# Patient Record
Sex: Female | Born: 1975 | Race: White | Hispanic: No | Smoking: Current every day smoker
Health system: Southern US, Community
[De-identification: ages and names within clinical notes are randomized; demographics above are authoritative.]

## PROBLEM LIST (undated history)

## (undated) DIAGNOSIS — M4120 Other idiopathic scoliosis, site unspecified: Secondary | ICD-10-CM

## (undated) DIAGNOSIS — N289 Disorder of kidney and ureter, unspecified: Secondary | ICD-10-CM

## (undated) DIAGNOSIS — M1990 Unspecified osteoarthritis, unspecified site: Secondary | ICD-10-CM

## (undated) HISTORY — PX: BREAST SURGERY: SHX581

## (undated) HISTORY — PX: BACK SURGERY: SHX140

---

## 2006-10-16 ENCOUNTER — Ambulatory Visit: Payer: Self-pay | Admitting: Physical Medicine & Rehabilitation

## 2006-10-16 ENCOUNTER — Encounter
Admission: RE | Admit: 2006-10-16 | Discharge: 2007-01-14 | Payer: Self-pay | Admitting: Physical Medicine & Rehabilitation

## 2006-12-01 ENCOUNTER — Ambulatory Visit: Payer: Self-pay | Admitting: Physical Medicine & Rehabilitation

## 2006-12-30 ENCOUNTER — Encounter
Admission: RE | Admit: 2006-12-30 | Discharge: 2007-03-30 | Payer: Self-pay | Admitting: Physical Medicine & Rehabilitation

## 2007-02-23 ENCOUNTER — Ambulatory Visit: Payer: Self-pay | Admitting: Physical Medicine & Rehabilitation

## 2007-04-21 ENCOUNTER — Encounter
Admission: RE | Admit: 2007-04-21 | Discharge: 2007-07-20 | Payer: Self-pay | Admitting: Physical Medicine & Rehabilitation

## 2007-04-21 ENCOUNTER — Ambulatory Visit: Payer: Self-pay | Admitting: Physical Medicine & Rehabilitation

## 2007-06-16 ENCOUNTER — Ambulatory Visit: Payer: Self-pay | Admitting: Physical Medicine & Rehabilitation

## 2007-07-22 ENCOUNTER — Emergency Department (HOSPITAL_COMMUNITY): Admission: EM | Admit: 2007-07-22 | Discharge: 2007-07-22 | Payer: Self-pay | Admitting: Emergency Medicine

## 2007-09-17 ENCOUNTER — Ambulatory Visit: Payer: Self-pay | Admitting: Physical Medicine & Rehabilitation

## 2007-09-17 ENCOUNTER — Encounter
Admission: RE | Admit: 2007-09-17 | Discharge: 2007-09-20 | Payer: Self-pay | Admitting: Physical Medicine & Rehabilitation

## 2007-09-23 ENCOUNTER — Ambulatory Visit (HOSPITAL_COMMUNITY): Admission: RE | Admit: 2007-09-23 | Discharge: 2007-09-23 | Payer: Self-pay | Admitting: Urology

## 2007-11-21 ENCOUNTER — Emergency Department (HOSPITAL_COMMUNITY): Admission: EM | Admit: 2007-11-21 | Discharge: 2007-11-21 | Payer: Self-pay | Admitting: Emergency Medicine

## 2007-12-09 ENCOUNTER — Ambulatory Visit: Payer: Self-pay | Admitting: Physical Medicine & Rehabilitation

## 2007-12-09 ENCOUNTER — Encounter
Admission: RE | Admit: 2007-12-09 | Discharge: 2007-12-13 | Payer: Self-pay | Admitting: Physical Medicine & Rehabilitation

## 2008-03-06 ENCOUNTER — Encounter
Admission: RE | Admit: 2008-03-06 | Discharge: 2008-03-08 | Payer: Self-pay | Admitting: Physical Medicine & Rehabilitation

## 2008-03-08 ENCOUNTER — Ambulatory Visit: Payer: Self-pay | Admitting: Physical Medicine & Rehabilitation

## 2008-06-06 ENCOUNTER — Encounter
Admission: RE | Admit: 2008-06-06 | Discharge: 2008-06-07 | Payer: Self-pay | Admitting: Physical Medicine & Rehabilitation

## 2008-06-07 ENCOUNTER — Ambulatory Visit: Payer: Self-pay | Admitting: Physical Medicine & Rehabilitation

## 2008-09-05 ENCOUNTER — Encounter
Admission: RE | Admit: 2008-09-05 | Discharge: 2008-12-04 | Payer: Self-pay | Admitting: Physical Medicine & Rehabilitation

## 2008-09-06 ENCOUNTER — Ambulatory Visit: Payer: Self-pay | Admitting: Physical Medicine & Rehabilitation

## 2008-10-24 ENCOUNTER — Ambulatory Visit (HOSPITAL_COMMUNITY): Admission: RE | Admit: 2008-10-24 | Discharge: 2008-10-24 | Payer: Self-pay | Admitting: Urology

## 2008-11-30 ENCOUNTER — Ambulatory Visit: Payer: Self-pay | Admitting: Physical Medicine & Rehabilitation

## 2008-12-28 ENCOUNTER — Encounter
Admission: RE | Admit: 2008-12-28 | Discharge: 2009-03-28 | Payer: Self-pay | Admitting: Physical Medicine & Rehabilitation

## 2008-12-29 ENCOUNTER — Ambulatory Visit: Payer: Self-pay | Admitting: Physical Medicine & Rehabilitation

## 2009-01-29 ENCOUNTER — Ambulatory Visit: Payer: Self-pay | Admitting: Physical Medicine & Rehabilitation

## 2009-03-06 ENCOUNTER — Ambulatory Visit: Payer: Self-pay | Admitting: Physical Medicine & Rehabilitation

## 2009-04-06 ENCOUNTER — Encounter
Admission: RE | Admit: 2009-04-06 | Discharge: 2009-07-02 | Payer: Self-pay | Admitting: Physical Medicine & Rehabilitation

## 2009-04-10 ENCOUNTER — Ambulatory Visit: Payer: Self-pay | Admitting: Physical Medicine & Rehabilitation

## 2009-05-09 ENCOUNTER — Ambulatory Visit: Payer: Self-pay | Admitting: Physical Medicine & Rehabilitation

## 2009-06-11 ENCOUNTER — Ambulatory Visit: Payer: Self-pay | Admitting: Physical Medicine & Rehabilitation

## 2009-07-31 ENCOUNTER — Encounter
Admission: RE | Admit: 2009-07-31 | Discharge: 2009-10-29 | Payer: Self-pay | Admitting: Physical Medicine & Rehabilitation

## 2009-07-31 ENCOUNTER — Ambulatory Visit: Payer: Self-pay | Admitting: Physical Medicine & Rehabilitation

## 2009-08-29 ENCOUNTER — Ambulatory Visit: Payer: Self-pay | Admitting: Physical Medicine & Rehabilitation

## 2009-09-26 ENCOUNTER — Ambulatory Visit: Payer: Self-pay | Admitting: Physical Medicine & Rehabilitation

## 2009-10-26 ENCOUNTER — Encounter
Admission: RE | Admit: 2009-10-26 | Discharge: 2009-11-08 | Payer: Self-pay | Admitting: Physical Medicine & Rehabilitation

## 2009-10-26 ENCOUNTER — Ambulatory Visit: Payer: Self-pay | Admitting: Physical Medicine & Rehabilitation

## 2009-12-26 ENCOUNTER — Encounter
Admission: RE | Admit: 2009-12-26 | Discharge: 2010-03-26 | Payer: Self-pay | Admitting: Physical Medicine & Rehabilitation

## 2009-12-28 ENCOUNTER — Ambulatory Visit: Payer: Self-pay | Admitting: Physical Medicine & Rehabilitation

## 2010-01-28 ENCOUNTER — Ambulatory Visit: Payer: Self-pay | Admitting: Physical Medicine & Rehabilitation

## 2010-02-28 ENCOUNTER — Ambulatory Visit: Payer: Self-pay | Admitting: Physical Medicine & Rehabilitation

## 2010-03-26 ENCOUNTER — Encounter
Admission: RE | Admit: 2010-03-26 | Discharge: 2010-06-24 | Payer: Self-pay | Admitting: Physical Medicine & Rehabilitation

## 2010-03-28 ENCOUNTER — Ambulatory Visit: Payer: Self-pay | Admitting: Physical Medicine & Rehabilitation

## 2010-05-15 ENCOUNTER — Ambulatory Visit: Payer: Self-pay | Admitting: Physical Medicine & Rehabilitation

## 2010-06-05 ENCOUNTER — Emergency Department (HOSPITAL_COMMUNITY): Admission: EM | Admit: 2010-06-05 | Discharge: 2010-06-05 | Payer: Self-pay | Admitting: Emergency Medicine

## 2010-06-06 ENCOUNTER — Ambulatory Visit (HOSPITAL_COMMUNITY): Admission: RE | Admit: 2010-06-06 | Discharge: 2010-06-06 | Payer: Self-pay | Admitting: Urology

## 2010-06-13 ENCOUNTER — Ambulatory Visit: Payer: Self-pay | Admitting: Physical Medicine & Rehabilitation

## 2010-07-04 ENCOUNTER — Encounter
Admission: RE | Admit: 2010-07-04 | Discharge: 2010-10-02 | Payer: Self-pay | Admitting: Physical Medicine & Rehabilitation

## 2010-07-11 ENCOUNTER — Ambulatory Visit: Payer: Self-pay | Admitting: Physical Medicine & Rehabilitation

## 2010-08-14 ENCOUNTER — Ambulatory Visit: Payer: Self-pay | Admitting: Physical Medicine & Rehabilitation

## 2010-08-24 ENCOUNTER — Emergency Department (HOSPITAL_COMMUNITY): Admission: EM | Admit: 2010-08-24 | Discharge: 2010-08-24 | Payer: Self-pay | Admitting: Emergency Medicine

## 2010-09-11 ENCOUNTER — Ambulatory Visit: Payer: Self-pay | Admitting: Physical Medicine & Rehabilitation

## 2010-10-02 ENCOUNTER — Encounter
Admission: RE | Admit: 2010-10-02 | Discharge: 2010-11-07 | Payer: Self-pay | Source: Home / Self Care | Attending: Physical Medicine & Rehabilitation | Admitting: Physical Medicine & Rehabilitation

## 2010-10-09 ENCOUNTER — Ambulatory Visit: Payer: Self-pay | Admitting: Physical Medicine & Rehabilitation

## 2010-11-07 ENCOUNTER — Ambulatory Visit: Payer: Self-pay | Admitting: Physical Medicine & Rehabilitation

## 2010-12-05 ENCOUNTER — Encounter
Admission: RE | Admit: 2010-12-05 | Discharge: 2010-12-10 | Payer: Self-pay | Source: Home / Self Care | Attending: Physical Medicine & Rehabilitation | Admitting: Physical Medicine & Rehabilitation

## 2010-12-08 ENCOUNTER — Encounter: Payer: Self-pay | Admitting: Orthopedic Surgery

## 2010-12-10 ENCOUNTER — Ambulatory Visit
Admission: RE | Admit: 2010-12-10 | Discharge: 2010-12-10 | Payer: Self-pay | Source: Home / Self Care | Attending: Physical Medicine & Rehabilitation | Admitting: Physical Medicine & Rehabilitation

## 2011-01-09 ENCOUNTER — Encounter: Payer: Medicare Other | Attending: Physical Medicine & Rehabilitation

## 2011-01-09 ENCOUNTER — Ambulatory Visit: Payer: Medicare Other

## 2011-01-09 DIAGNOSIS — M47812 Spondylosis without myelopathy or radiculopathy, cervical region: Secondary | ICD-10-CM | POA: Insufficient documentation

## 2011-01-09 DIAGNOSIS — M4120 Other idiopathic scoliosis, site unspecified: Secondary | ICD-10-CM

## 2011-01-09 DIAGNOSIS — F17200 Nicotine dependence, unspecified, uncomplicated: Secondary | ICD-10-CM | POA: Insufficient documentation

## 2011-01-09 DIAGNOSIS — F341 Dysthymic disorder: Secondary | ICD-10-CM | POA: Insufficient documentation

## 2011-01-09 DIAGNOSIS — M47814 Spondylosis without myelopathy or radiculopathy, thoracic region: Secondary | ICD-10-CM | POA: Insufficient documentation

## 2011-02-01 LAB — URINALYSIS, ROUTINE W REFLEX MICROSCOPIC
Bilirubin Urine: NEGATIVE
Ketones, ur: NEGATIVE mg/dL
Nitrite: NEGATIVE
Urobilinogen, UA: 0.2 mg/dL (ref 0.0–1.0)

## 2011-02-01 LAB — DIFFERENTIAL
Eosinophils Absolute: 0.3 10*3/uL (ref 0.0–0.7)
Lymphs Abs: 2.3 10*3/uL (ref 0.7–4.0)
Monocytes Absolute: 0.6 10*3/uL (ref 0.1–1.0)
Monocytes Relative: 8 % (ref 3–12)
Neutrophils Relative %: 61 % (ref 43–77)

## 2011-02-01 LAB — BASIC METABOLIC PANEL
CO2: 23 mEq/L (ref 19–32)
Calcium: 8.8 mg/dL (ref 8.4–10.5)
Chloride: 108 mEq/L (ref 96–112)
GFR calc Af Amer: >60 mL/min (ref >60)
Glucose, Bld: 87 mg/dL (ref 70–99)
Potassium: 4 mEq/L (ref 3.5–5.1)
Sodium: 138 mEq/L (ref 135–145)

## 2011-02-01 LAB — CBC
HCT: 36.8 % (ref 36.0–46.0)
Hemoglobin: 12.2 g/dL (ref 12.0–15.0)
MCH: 31.5 pg (ref 26.0–34.0)
MCV: 95.2 fL (ref 78.0–100.0)
RBC: 3.86 MIL/uL — ABNORMAL LOW (ref 3.87–5.11)

## 2011-02-06 ENCOUNTER — Encounter: Payer: Medicare Other | Attending: Physical Medicine & Rehabilitation

## 2011-02-06 ENCOUNTER — Ambulatory Visit: Payer: Medicare Other

## 2011-02-06 DIAGNOSIS — M545 Low back pain, unspecified: Secondary | ICD-10-CM | POA: Insufficient documentation

## 2011-02-06 DIAGNOSIS — F17200 Nicotine dependence, unspecified, uncomplicated: Secondary | ICD-10-CM | POA: Insufficient documentation

## 2011-02-06 DIAGNOSIS — M47812 Spondylosis without myelopathy or radiculopathy, cervical region: Secondary | ICD-10-CM | POA: Insufficient documentation

## 2011-02-06 DIAGNOSIS — Z981 Arthrodesis status: Secondary | ICD-10-CM | POA: Insufficient documentation

## 2011-02-06 DIAGNOSIS — M4120 Other idiopathic scoliosis, site unspecified: Secondary | ICD-10-CM

## 2011-02-06 DIAGNOSIS — F341 Dysthymic disorder: Secondary | ICD-10-CM | POA: Insufficient documentation

## 2011-02-06 DIAGNOSIS — M79609 Pain in unspecified limb: Secondary | ICD-10-CM | POA: Insufficient documentation

## 2011-02-06 DIAGNOSIS — M47814 Spondylosis without myelopathy or radiculopathy, thoracic region: Secondary | ICD-10-CM | POA: Insufficient documentation

## 2011-03-07 ENCOUNTER — Encounter: Payer: Medicare Other | Attending: Physical Medicine & Rehabilitation | Admitting: Physical Medicine & Rehabilitation

## 2011-03-07 DIAGNOSIS — M47817 Spondylosis without myelopathy or radiculopathy, lumbosacral region: Secondary | ICD-10-CM

## 2011-03-07 DIAGNOSIS — M47814 Spondylosis without myelopathy or radiculopathy, thoracic region: Secondary | ICD-10-CM | POA: Insufficient documentation

## 2011-03-07 DIAGNOSIS — M549 Dorsalgia, unspecified: Secondary | ICD-10-CM | POA: Insufficient documentation

## 2011-03-07 DIAGNOSIS — M79609 Pain in unspecified limb: Secondary | ICD-10-CM | POA: Insufficient documentation

## 2011-03-07 DIAGNOSIS — M4120 Other idiopathic scoliosis, site unspecified: Secondary | ICD-10-CM

## 2011-03-07 DIAGNOSIS — Y831 Surgical operation with implant of artificial internal device as the cause of abnormal reaction of the patient, or of later complication, without mention of misadventure at the time of the procedure: Secondary | ICD-10-CM | POA: Insufficient documentation

## 2011-03-07 DIAGNOSIS — M47812 Spondylosis without myelopathy or radiculopathy, cervical region: Secondary | ICD-10-CM | POA: Insufficient documentation

## 2011-03-07 DIAGNOSIS — M961 Postlaminectomy syndrome, not elsewhere classified: Secondary | ICD-10-CM

## 2011-03-07 DIAGNOSIS — T84498A Other mechanical complication of other internal orthopedic devices, implants and grafts, initial encounter: Secondary | ICD-10-CM | POA: Insufficient documentation

## 2011-03-07 DIAGNOSIS — F341 Dysthymic disorder: Secondary | ICD-10-CM | POA: Insufficient documentation

## 2011-03-07 DIAGNOSIS — IMO0001 Reserved for inherently not codable concepts without codable children: Secondary | ICD-10-CM | POA: Insufficient documentation

## 2011-03-08 NOTE — Assessment & Plan Note (Signed)
[name] is back regarding her back pain.  No decision has been made regarding surgery on her back yet as she is still a bit hesitant and wants to wait until her children are out of school.  Opana was increased in January, seems to have helped her.  Pain is about 5-6/10.  She has some sensations of leg weakness and numbness at times on the left.  She also feels her hardware move in the low back and frankly the hardware is broken by report.  Her sleep is fair.  She has occasional muscle spasms and trigger points in the shoulders and shoulder girdle muscles and trigger points helped her back in January.  She still gets these and does some massage to work them out when they do happen.  REVIEW OF SYSTEMS:  Notable for the above.  She does report depression, anxiety, occasional constipation, and night sweats.  Full 12-point review in the written health and history section of the chart.  SOCIAL HISTORY:  Unchanged.  She is divorced, living with her children. She is still smoking.  PHYSICAL EXAMINATION:  VITAL SIGNS:  Blood pressure is 106/76, pulse is 120, respiratory rate 18, and she is saturating 100% on room air. GENERAL:  The patient is pleasant, alert, and oriented x3.  She seems to have lost some weight.  She is very tan.  Mood is pleasant. MUSCULOSKELETAL:  Posture is still very poor as she has significant elevation of the right hemipelvis and levoscoliosis of the thoracolumbar spine.  She is able to bend to about 60-70 degrees with discomfort.  She is limited with rotation to the left and bending to the left today. Extension was achieved to about 15 degrees.  All these movements caused some pain.  ASSESSMENT: 1. 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  The patient has fractured hardware     by report. 2. Cervical spondylosis. 3. Reactive depression. 4. Myofascial pain, partially related to her posture and  overuse.  PLAN: 1. Encouraged overall stretching and modalities for her myofascial     pain.  She needs to be sensible about her activities. 2. We will stay with the Opana ER 30 q.12 h as this seems to have     improved her pain control.  She will use oxycodone 7.5 for     breakthrough pain 1 q.6 h p.r.n., #120. 3. Discussed smoking cessation. 4. Continue other medications as written. 5. Nurse practitioner will see her back in a month.     Ranelle Oyster, M.D. Electronically Signed    ZTS/MedQ D:  03/07/2011 13:38:20  T:  03/08/2011 02:29:01  Job #:  [id number]

## 2011-04-01 NOTE — Assessment & Plan Note (Signed)
The patient returns to the clinic today accompanied by her husband.  The  patient reports that she still has some problems with her kidneys and  continues to see her kidney specialist, Dennie Maizes, M.D.  Follow-up  is scheduled for September 24, 2007.  She has had several kidney stones  and reportedly has inflammation of her ureters.   Concerning her back, she has had increased back pain since her surgery  with Dr. Cherene Altes in January of 2008.  Dr. Cherene Altes apparently wishes to do a  second surgery at the L5-S1 disk level.  The patient reports that  reportedly bolts are backing out.  She also plans to get a second  opinion with an orthopedist, Marlowe Kays, M.D. in [place].  She  does not have a specific date for that appointment.   The patient reports that she is unable to tolerate the Robaxin or  Trazodone as they gave her a hangover.  She is caring for 4-5 children  at the present time with her husband who is the only bread winner.  The  patient has applied for disability, but has been denied at this point.  She does need a refill on her Oxycodone in the office today.   MEDICATIONS:  1. Oxycodone 7.5/325 one to two tablets p.o. q.i.d. p.r.n.  2. Robaxin (unable to tolerate).  3. Trazodone (unable to tolerate).   REVIEW OF SYSTEMS:  Positive for constipation, urinary retention, high  blood sugar.   PHYSICAL EXAMINATION:  GENERAL:  Reasonably well appearing young adult  female in mild acute discomfort.  VITAL SIGNS:  Not obtained in the office today.  She has 5-/5 strength  throughout the bilateral upper extremities and 4/5 strength throughout  the bilateral lower extremities.   IMPRESSION:  1. Moderate to severe lower thoracic to lumbar scoliosis, status post      T1-L5 fusion for severe scoliosis.  2. Reported L5 tear per latest CAT scan.   In the office today, we did refill the patient's Oxycodone 7.5/325 one  to two tablets p.o. q.i.d.  She still has approximately six  remaining  from the prescription filled on August 19, 2007.  She certainly does not  overuse the medication and generally uses 7-8 per day with reasonable  relief.  I will fill the prescription for her in the office today.  We  will plan on seeing her in follow-up in approximately three months'  time.           ______________________________  Ellwood Dense, M.D.     DC/MedQ  D:  09/20/2007 11:38:54  T:  09/20/2007 21:29:03  Job #:  [id number]

## 2011-04-01 NOTE — Assessment & Plan Note (Signed)
[name] is back regarding her chronic back pain.  She stopped taking the  Topamax after a month as she did not see any effects; however, she had  no problems.  She feels that her pain had gotten worse in the neck and  back as well as her legs.  Mood is also an ongoing problem as well.  Her  pain is 6-7/10 today, unchanged from last visit.  Pain is described as  sharp, burning, dull, stabbing, constant, tingling, and aching.  Pain  interferes with general activity, relations with others, enjoyment of  life on moderate-to-severe level.  Sleep is fair.   REVIEW OF SYSTEMS:  Notable for the above.  The patient does report some  weakness, trouble walking, spasms, depression, anxiety, low blood  sugars, some weight gain, night sweats, urine retention, limb swelling.  Full review is in the written health and history section.  Other  pertinent positives are above.   SOCIAL HISTORY:  The patient is divorced, living with her child.  She is  still smoking regularly.   PHYSICAL EXAMINATION:  Blood pressure is 138/72, pulse is 99,  respiratory rate 18.  She is sating 99% on room air.  The patient is  pleasant, alert and oriented x3.  She continues to have tenderness,  particularly in the low back at the operative site.  Strength in all 4  extremities, however, is 5/5 with 2 plus reflexes and normal sensory  examination.  She has pain with flexion of the low back and neck today  with equivocal extension and provocation in the back and neck.  Mood is  a bit anxious, although cognition is intact.  Cranial nerve exam is  normal.  Heart is regular.  Chest is clear.  Abdomen is soft, nontender.  The patient's gait essentially is normal.   ASSESSMENT:  1. History of thoracolumbar scoliosis status post fusion surgery x2.  2. Cervical spondylosis with myofascial pain.  3. 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  4. Reactive depression.  5. Central sensitization.   PLAN:  1. We  trialed Topamax starting at 50 mg at bedtime increasing to 100      mg to improve sleep and perhaps stabilize mood.  2. We will change Celexa over to Effexor, ultimately moving to 75 mg      daily dose for starters.  3. The patient may need follow up with Dr. Elvina Sidle regarding her back and      surgery, although I am not sure what else he would add at this      point.  The patient seems to feel that she might benefit by seeing      him.  I did not truly encourage her one over the other.  4. Encourage TENS and back exercises.  5. Percocet for breakthrough pain 7.5, #120, one q.6 h. p.r.n.  6. I will see her back in about 2 months' time with nursing followup      in 1 month.      Ranelle Oyster, M.D.  Electronically Signed     ZTS/MedQ  D:  06/11/2009 10:50:26  T:  06/12/2009 01:05:25  Job #:  [id number]   cc:   Sharon Seller A. Elvina Sidle, MD   Dierdre Highman. Loney Hering, MD

## 2011-04-01 NOTE — Assessment & Plan Note (Signed)
[name] returns to the clinic today for followup evaluation.  She  had her surgery with Dr. Elvina Sidle November 20, 2007.  She has had persistent  pain since that time, and Dr. Elvina Sidle reportedly has told her that she will  need another surgery in the near future.  She wanted a second opinion  and sought that with Dr. Wynetta Emery, a local neurosurgeon.  Dr. Wynetta Emery  apparently agreed with Dr. Elvina Sidle that a repeat back surgery was necessary.  She is still trying to decide when that would be done as she is trying  to care for her three small children.   The patient reports that she has also been hospitalized at [facility] November 21, 2007 for a chronic kidney disorder with kidney  stones.  She reports that she was placed on ciprofloxacin for 14 days,  and is still trying to get in to see her kidney specialist, Dr.  Rito Ehrlich.   The patient had undergone subsequent CAT scan of her lumbar spine  December 08, 2007.  This showed no fracture, subluxation, disk herniation  or spinal stenosis.  There were prior surgical changes noted.  There is  no subluxation or new fracture seen with minimal spondylosis being  identified.  There was a subcortical cyst formation involving the sacrum  on the left side at the SI joint level.  No erosivesacroiliitis was  seen.   Patient reports that she does get reasonable relief from the oxycodone  7.5/325 used approximately 6-8 tablets per day.  She does need a refill  over the next five days.   MEDICATIONS:  Oxycodone 7.5/325 one to two tablets p.o. q.i.d. p.r.n. (6-  8 per day).   REVIEW OF SYSTEMS:  Noncontributory.   PHYSICAL EXAMINATION:  Well-appearing young adult female in mild acute  discomfort.  Blood pressure:  117/61 with a pulse of  81.  Respiratory  rate:  18.  O2 saturation 96% on room air.  She has 5-/5 strength  throughout the bilateral upper extremities and 4/5 strength through the  bilateral lower extremities.   IMPRESSION:  1. Moderate to severe  lower thoracic to lumbar scoliosis, status post      T1-L5 fusion for severe scoliosis.  2. Reported L5 tear per CAT scan.  3. Minimal lumbar degenerative disk disease.  4. History of chronic kidney disorder with kidney stones.   In the office today we did refill the patient's oxycodone as of December 18, 2007.  At this point we will have to fill out repeat paperwork for  her as she does not feel that she could work even eight hours a day at a  sedentary position.  I unfortunately made out paperwork to a different  level when I thought that in early December 2008.  Once she sends [place]  repeat paperwork, we will fill it out as noted above.  We will plan on  seeing the patient in followup in approximately three months' time.  She  still plans to see Dr. Elvina Sidle tomorrow, and they are still planning surgery  either imminent over the next few weeks or possibly in June if at all  possible to accommodate her children's schedules.           ______________________________  Ellwood Dense, M.D.     DC/MedQ  D:  12/13/2007 11:25:36  T:  12/13/2007 14:25:51  Job #:  [id number]

## 2011-04-01 NOTE — Assessment & Plan Note (Signed)
[name] returns to the clinic today for follow-up evaluation.  She  reports that she did see Dr. Elvina Sidle in follow-up. She reports that she was  having severe pain and they did a CAT scan of her lumbar spine at Dr.  Layla Maw office.  She reports that Dr. Elvina Sidle subsequently called back saying  that she has a tear in the L5 disc along with arthritis.  They are  planning to see her in follow-up June 04, 2007 when they may be doing  injections or possibly recommending further surgery.   The patient reports that she is not getting as much relief from the  Flexeril that had been used for sleep and muscle relief. She would like  to try a different medication for that problem.  In terms of her pain  medicine she has been using the oxycodone 7.5/325 one tablet 4 times per  day.  She actually was prescribed 1 to 2 tablets p.o. q.i.d. but she  reports that she is just now out of a script given to her February 26, 2007  for 240 tablets.  Those 240 should have covered her only through May  11th but they have lasted her until early June.  She definitely has not  been using them at the allowable dosage.  She has been under using the  medications and I have explained that to her in the office today along  with her friend.   MEDICATIONS:  1. Oxycodone 7.5/325 one to two tablets p.o. q.i.d. p.r.n..  2. Flexeril 10 mg p.o. nightly p.r.n.   REVIEW OF SYSTEMS:  Noncontributory.   PHYSICAL EXAMINATION:  This is a reasonably well appearing, young adult  female in mild-to-moderate acute discomfort.  Blood pressure is 113/70,  with a pulse 94, O2 saturation 100% on room air.  She has 5-/5 strength  throughout the bilateral upper extremities and 4/5 strength throughout  the bilaterally lower extremities.   IMPRESSION:  1. Moderate-to-severe lower thoracic to lumbar scoliosis, status post      T11-L5 fusion for severe scoliosis.  2. Reported L5 tear per latest CAT scan.   In the office today we did refill the  patient's oxycodone 7.5/325 and  made sure that she was aware that she could use it 1 to 2 tablets p.o.  q.i.d. a maximum of 8 per day.  We also took her off the Flexeril and  instead are using Robaxin 500 p.o. nightly and trazodone 50 mg 1 to 2  tablets p.o. nightly p.r.n.  We will plan on seeing her in follow-up in  approximately 2 to 3 months' time with refills prior to that appointment  if necessary.           ______________________________  Ellwood Dense, M.D.     DC/MedQ  D:  04/22/2007 10:30:58  T:  04/22/2007 10:54:02  Job #:  [id number]

## 2011-04-01 NOTE — Assessment & Plan Note (Signed)
Ms. Leiva returns to clinic today for followup evaluation.  She  reports that she and Dr. Cherene Altes had subsequent discussions regarding her  back, and the problems that she is experiencing.  They are suggesting  that she have subsequent surgery to remove prior hardware and replace  hardware along with bigger screws.  They also are suggesting, according  to the patient, scraping off the bottom of the L5 disk.  She and her  surgeon are still trying to come up with a specific date for this  surgery.   In the meantime, the patient reports that she gets a fair amount of  relief from the oxycodone used up to 8 tablets per day.  She is getting  better relief with the Robaxin compared to the prior Flexeril.  She  reports that trazodone at 50 mg to 100 mg makes her too sleepy to attend  to the kids through the night.  She has not tried taking 1/2 of a 50-mg  tablet.   MEDICATIONS:  1. Oxycodone 7.5/325 one to two tablets p.o. q.i.d. p.r.n.  2. Robaxin 500 mg p.o. nightly p.r.n.  3. Trazodone 50 mg 1 to 2 tablets p.o. nightly p.r.n.   REVIEW OF SYSTEMS:  Noncontributory.   PHYSICAL EXAMINATION:  Well-appearing adult female in mild acute  discomfort.  Blood pressure 115/72 with a pulse of 97.  Respiratory rate 18.  Her O2  saturation 100% on room air.  She has 5-/5 strength throughout the bilateral upper extremities, and  4/5 strength throughout the bilateral lower extremities.   IMPRESSION:  1. Moderate to severe lower thoracic to lumbar scoliosis, status post      T11/L5 fusion for severe scoliosis.  2. Reported L5 tear per latest CAT scan.   In the office today, we did refill the patient's oxycodone as of June 19, 2007.  She will continue with the Robaxin.  I have asked her to try  the trazodone at 1/2 of a 50-mg strength to see if that gives her some  benefit for sleep, but also allows her to be alert to attend to the  children.  We will plan on seeing her in followup in approximately  3  months' time with refills prior to that appointment if necessary.           ______________________________  Ellwood Dense, M.D.     DC/MedQ  D:  06/17/2007 09:52:46  T:  06/17/2007 15:40:30  Job #:  [id number]

## 2011-04-01 NOTE — Assessment & Plan Note (Signed)
[name] returns to the clinic today for followup evaluation.  She  reports that her primary care physician has started her on Celexa 20 mg  daily for anxiety and depression.  She also reports that Dr. Elvina Sidle, her  surgeon, has ordered CAT scan of her lumbar spine with results pending.  She also was sent for physical therapy after she had some degenerative  changes found in her left shoulder.  She is using her Percocet  approximately 6-8 tablets per day, but is out of the Valium.  She does  complain of increased spasms off the Valium at present.   REVIEW OF SYSTEMS:  Positive for fever and chills, skin rash, night  sweats, constipation, and urinary retention.   MEDICATIONS:  1. Oxycodone 10/325 one to two tablets p.o. q.i.d. p.r.n. (6-8 per      day).  2. Valium 5 mg b.i.d. p.r.n. (out at present).   PHYSICAL EXAMINATION:  GENERAL:  Well-appearing, young adult female  seated in a regular chair.  VITAL SIGNS:  Blood pressure is 121/71 with pulse 88, respiratory rate  18, and O2 saturation 100% on room air.  She has 5-/5 strength  throughout the bilateral upper extremities and 4/5 strength throughout  the bilateral lower extremities.  She ambulates without any assistive  device.   IMPRESSION:  1. Moderate-to-severe lower thoracic-to-lumbar scoliosis, status post      T1-L5 fusion for severe scoliosis.  2. Reported L5 disk tear per CAT scan.  3. Minimal lumbar degenerative disk disease.  4. History of chronic renal disorder with kidney stones.   PLAN:  In the office today, we did refill the patient's Valium 5 mg  b.i.d. p.r.n.  We also refilled her Percocet as of September 16, 2008,  maximum of 8 per day.  We will plan on seeing the patient in followup in  approximately 3 months' time with refills prior to that appointment as  necessary.           ______________________________  Ellwood Dense, M.D.     DC/MedQ  D:  09/06/2008 11:13:33  T:  09/07/2008 00:12:56  Job #:   [id number]

## 2011-04-01 NOTE — Assessment & Plan Note (Signed)
HISTORY:  Ms. Grondahl returns to the clinic today for follow-up  evaluation.  She had her surgery done on February 02, 2008.  She was  prescribed Valium 5 mg twice daily by Dr. Dorita Fray, her neurosurgeon.  She is out of that medication and reports significant spasms of her left  lower back.  She would like to restart that medication.  She also  reports taking the oxycodone 7.5/325 mg approximately six to eight tab  daily and getting reasonable relief overall.  She and her attorney are  in the process for applying for disability, as best as I can tell.   MEDICATIONS:  1. Oxycodone 7.5/325 mg, one to two tab p.o. four times daily p.r.n.      (six to eight per day).  2. Valium 5 mg twice daily p.r.n.   REVIEW OF SYSTEMS:  Positive for night sweats, fever and chills,  constipation, poor appetite, abdominal pain, urinary retention, limb  swelling and shortness of breath.   PHYSICAL EXAMINATION:  GENERAL:  A well-appearing young adult female,  seated in a regular chair, in mild to no acute discomfort.  VITAL SIGNS:  Blood pressure 121/81, pulse 108, respirations 18, O2  saturation 100% on room air.  NEUROLOGIC:  She has 5-/5 strength  throughout the bilateral upper extremities and 4/5 strength throughout  the bilateral lower extremities.   IMPRESSION:  1. Moderate to severe lower thoracic to lumbar scoliosis, status post      T1-L5 fusion for severe scoliosis.  2. Reported L5 tear per CAT scan.  3. Minimal lumbar degenerative disk disease.  4. History of chronic kidney disorder with kidney stones.   In the office today we did give the patient a refill on her oxycodone.  We also refilled her Valium in a 5 mg twice daily strength and  frequency.   FOLLOWUP:  We will plan on seeing the patient in followup in this office  in approximately three months' time.           ______________________________  Ellwood Dense, M.D.     DC/MedQ  D:  03/08/2008 11:39:25  T:  03/08/2008 12:53:41   Job #:  [id number]

## 2011-04-01 NOTE — Assessment & Plan Note (Signed)
[name] is back regarding her chronic pain.  Her back knees give her  problem as well as both legs.  Her left shoulder particularly is hurting  her.  She has had over the last week or so flare-up of her right knee  pain.  Apparently, she has osteoarthritis.  She has never formally had a  workup apparently.  The often swells are related to the amount of  activity she undertakes.  Her pain is 6-7/10.  The pain is sharp,  burning, stabbing, aching, constant, intermittent at times.  Pain is  interfering with general activity, relations with others, enjoyment of  life on a moderate-to-severe level.  Sleep is poor still.   She tried the fentanyl patch.  She is still taking this, but having  nausea.  It does not feel that she has a significant benefit with it.  She stopped the Neurontin that she felt that was making her more  sensitive to pain.   REVIEW OF SYSTEMS:  Notable for multiple items as outlined in her health  and history section.  Other pertinent positives are above.   SOCIAL HISTORY:  The patient is divorced, living with a boyfriend, and  children.  Boyfriend is with her today.   PHYSICAL EXAMINATION:  VITAL SIGNS:  Blood pressure is 125/71, pulse is  104, respiratory rate 18.  She is sating 99% on room air.  GENERAL:  The patient is pleasant, alert, and oriented x3.  Affect is  generally bright and appropriate.  EXTREMITIES:  Gait is generally stable.  Reflexes are 2+ in both knees  at 3-4/5 strength in both legs with inhibition.  The right knee had some  mild crepitus in the infrapatellar region.  No obvious meniscal signs  were elicited today.  Back was generally tender to touch once again.  We  ranged the neck and she had pain in the left trapezius with all planes  of motion, particularly bending laterally to the left and right.  She  had palpable trigger point over the left trapezius today.  Spurling's  test was equivocal, but negative.  Strength is generally 5/5 in both  upper  extremities with normal range of motion in the arms.  Sensation  was normal.  HEART:  Slightly tachycardiac.  CHEST:  Clear.  ABDOMEN:  Soft, nontender.   ASSESSMENT:  1. History of thoracolumbar scoliosis, status post fusion surgery x2.      I reviewed her films today.  2. Cervical spondylosis and questionable radiculopathy.  3. Cervicothoracic myofascial pain.  4. Osteoarthritis of the right knee likely in the patellofemoral      compartment.  5. Reactive depression.  6. Central sensitization syndrome.   PLAN:  1. I like to try another anticonvulsant for her central pain aspects.      Topamax 25 mg nightly titrating up to 75 mg after 2 weeks' time.  2. We will replace her fentanyl patch with Opana ER 5 mg q.12 h.  3. Refilled oxycodone 7.5 one q.6 h. p.r.n. #120.  4. Maintain Mobic 15 mg daily.  5. After informed consent, we injected the right knee via lateral      approach using 40 mg of Kenalog and 3 mL of 1% lidocaine.  The      patient tolerated well.  The area was prepped with Betadine and      cleaned after injection.  6. Also, we injected the left trapezius trigger points x2 using 2 mL      of  1% lidocaine.  I elicited a nice twitch response on the second      injection.  The patient overall tolerated well.  7. Encouraged use of soft brace for her right knee for now.  I think      she should work on some quad and calf stretching exercise as well      as hamstring exercises.  8. Follow up with orthopedic surgery.  9. I will see her back in a month.       Ranelle Oyster, M.D.  Electronically Signed     ZTS/MedQ  D:  03/06/2009 10:18:18  T:  03/06/2009 23:52:47  Job #:  [id number]   cc:   Dorita Fray  Fax: [phone]

## 2011-04-01 NOTE — Assessment & Plan Note (Signed)
[name] returns to clinic today for followup evaluation.  She did  see Dr. Elvina Sidle or at least the PA with Dr. Elvina Sidle in May 2009.  She is due for  followup in June 16, 2008.  She still has pain across her chest and in  her axilla regions bilaterally especially in the left.  She feels that  somewhat related to rib pain from her prior scoliosis surgery.  She did  undergo T1-L5 fusion for scoliosis by Dr. Elvina Sidle several months ago.   The patient does use the Percocet approximately 6 to 8 tablets per day.  She does need a refill over the next several days.  She also request  that we refill the Valium at 5 mg twice a day as needed, as Dr. Elvina Sidle  apparently is not willing to do that for her.   She reports that her primary care physician is planning to start her on  the Celexa in the near future.  She feels that is for anxiety related to  her ongoing pain.   REVIEW OF SYSTEMS:  Positive for night sweats, low blood sugar,  constipation, poor appetite, and urinary retention.   MEDICATIONS:  1. Oxycodone 10/325 one to two tablets p.o. q.i.d. p.r.n. (6 to 8 per      day)  2. Valium 5 mg b.i.d. p.r.n.   PHYSICAL EXAMINATION:  GENERAL:  Well-appearing young adult female,  seated in a regular chair in mild-to-no acute discomfort.  VITAL SIGNS:  Blood pressure 124/67 with a pulse of 86, respiratory rate  20, and O2 saturation 100% on room air.  She has 5-/5 strength  throughout the bilateral upper extremities and 4/5 strength throughout  the bilateral lower extremities.  Bulk and tone were normal throughout,  and the patient ambulates without any assistive device.   IMPRESSION:  1. Moderate-to-severe lower thoracic to lumbar scoliosis, status post      T1-L5 fusion for severe scoliosis.  2. Reported L5 disk tear per CAT scan.  3. Minimal lumbar degenerative disk disease.  4. History of chronic kidney disorder with kidney stones.   In the office today, we did refill the patient's Valium as of today  June 07, 2008.  We referred her Percocet as of June 17, 2008, and she will  fill that over the next several days when her present prescription runs  out.  We will plan on seeing her in followup at approximately 3 month's  time with refills prior to that appointment if necessary.  She will be  discussing her ongoing pain with Dr. Elvina Sidle, but I suspect that there will  be no definite resolution with ongoing pain medicines required through  this office.           ______________________________  Ellwood Dense, M.D.     DC/MedQ  D:  06/07/2008 10:37:34  T:  06/08/2008 01:40:55  Job #:  [id number]

## 2011-04-01 NOTE — Assessment & Plan Note (Signed)
I am assuming care for this patient from Dr. Thomasena Edis who has followed  her chronically.  The patient is here for followup of her low back and  leg pain.  She has chronic thoracic and lumbar scoliosis status post 2  separate fusion surgeries, the last which was in March 2009.  She states  that with last surgery the pain has been persistent and really not  improved with pain prominent in low back and the left leg areas.  She  had a lot of paresthesias down the left leg with associated numbness and  weakness.  She also has cervical symptoms with radiation into the left  shoulder.  She sees Dr. Elvina Sidle in [place] for her surgical care.  He is  talking about doing a further cervical surgery.  He is recommending some  therapy first.  I discussed the patient's prior treatments before seeing  me today.  She has had therapy remotely for low back and nothing recent.  She has had injections in the past as well as the surgeries noted above.  She is really not on anything to treat her neurological pain symptoms  nor any anti-inflammatories.  She has been on a short-acting narcotics  per Dr. Thomasena Edis most recently with no trials of long-acting agent.  She  has been on Valium recently for spasms which she uses p.r.n..  Her  current oxycodone is written at 7.5/325 two q.i.d. essentially p.r.n.  She is using 8 a day for pain control.  She feels very limited with her  activities.  She has a hard time bending and doing things with her  children rather than taking care of herself.  She rates her pain at 5-  7/10 described as sharp, stabbing, constant tingling, and aching.  Pain  interferes with general activity, relations with others, and enjoyment  of life on a moderate-to-severe level.  Sleep is poor.  Leg numbness  happens with activity, even such things as crossing her legs.  She has  back pain and neck pain nearly all the time.  She states that she can  walk about 2 minutes without having to stop.   REVIEW  OF SYSTEMS:  Notable for bladder control problems, weakness,  numbness, tingling, trouble walking, spasms, depression, anxiety, weight  gain, fever, night sweats, low sugars, urinary retention, poor appetite,  limb swelling, and shortness of breath.  Full review is in the written  health and history section.  Other pertinent positives are above.   SOCIAL HISTORY:  The patient is divorced and living with her children.  She smokes 1-pack of cigarettes per day.   PHYSICAL EXAMINATION:  Blood pressure 120/72, pulse 93, respiratory rate  18.  She is sating 99% on room air.  The patient is pleasant, alert,  oriented x3.  Affect is bright and appropriate.  She is very limited  with all movements of low back.  She is able to stand and bend to  approximately 20 degrees before severe pain set in the low back and left  leg.  Straight leg raising was positive in left leg and slightly on the  right as well.  Extension was limited to 5 or 10 degrees with pain.  She  had tenderness with palpation over the lower lumbar paraspinals left  more than right, particularly from L3-S1, although there was pain to her  operative area more superiorly.  Pelvis was uneven with the right  elevated over the left by approximately 1 inch; however, there was a  leftward scoliosis apex in the lower thoracic spine.  Leg length  measurements did not reveal any discrepancies.  Strength is generally  4+/5 in the right.  She is 3+ to 4/5 in the left with pain inhibition.  Reflexes were 2+ at both knees, 2+ right ankle, 1+ left ankle.  Sensory  exam is grossly normal.  Upper extremity motor exam was generally 4 to  5/5 throughout with pain inhibition in both shoulders.  I did not  examine her neck and back today.   ASSESSMENT:  1. History of thoracolumbar scoliosis, status post 2 thoracolumbar      fusions last of which in March 2009.  The patient with ongoing      chronic low back pain and radiculopathy on the left.  She       apparently reported L5 disk injury per recent CAT scan which I do      not have available to me.  2. Reactive depression.  3. Cervical spondylosis and arthropathy as well.   PLAN:  1. We discussed about overall management of her pain condition.  I      would like to get her started on fentanyl patch 25 mcg q.72 h and      reduce oxycodone to 7.5 one q.6 h. p.r.n.  She was in agreement      with this.  Additionally, we will start her on anticonvulsive      Neurontin 300 mg nightly titrating up to t.i.d. over 10 days' time.  2. Begin nonsteroidal anti-inflammatory, Mobic 15 mg 1 p.o. q.a.m.  3. I would like her to bring in her most recent copy of her cervical      and lumbar films, so we can assess.  We can discuss further      interventional treatment.  I am in agreement with her cervical      physical therapy.  I would like to see if they can attempt TENS      with her as well.  Ultimately, I would like to get her in for low      back physical therapy to improve her range of motion, stamina,      posture, etc.  I think once her pain is decreased a bit, then we      can pursue this.  4. Use Valium only p.r.n.  5. I will see her back in approximately 1 month's time.      Ranelle Oyster, M.D.  Electronically Signed     ZTS/MedQ  D:  01/29/2009 12:49:47  T:  01/30/2009 03:14:41  Job #:  [id number]   cc:   Dorita Fray  Fax: [phone]   Ernestine Conrad, MD

## 2011-04-01 NOTE — Assessment & Plan Note (Signed)
[name] returns to clinic today for followup evaluation.  She  reports that overall she is getting good relief from her oxycodone and  Valium.  She does use approximately 6-8 per day of the oxycodone.  She  does use the Valium just as needed twice a day.  She does report that  she has increased pain in her left leg when she is sitting and crossing  her leg and also prolonged standing will increase her pain.   REVIEW OF SYSTEMS:  Positive for fever and chills, weight loss, night  sweats, poor appetite, limb swelling, shortness of breath, and urinary  retention.   MEDICATIONS:  1. Oxycodone 10/325 one to two tablets p.o. q.i.d. p.r.n. (6-8 per      day).  2. Valium 5 mg b.i.d. p.r.n. (1-2 per day).   The patient reports that her pain is interfering with her traveling,  social life, sleeping, standing, walking, sitting, lifting, and  household employment along with homemaking.   PHYSICAL EXAMINATION:  GENERAL:  A well-appearing young adult female  seated in a regular chair.  VITAL SIGNS:  Blood pressure was 129/69 with a pulse of 108, respiratory  rate 18, and O2 saturation 99% on room air.  MUSCULOSKELETAL:  She has 5-/5 strength throughout the bilateral upper  extremities and 4+/5 strength throughout the bilateral lower  extremities.  She ambulates without any assistive device.   IMPRESSION:  1. Moderate to severe lower thoracic to lumbar scoliosis, status post      T1-L5 fusion for severe scoliosis.  2. Reported L5 disk tear per CAT scan.  3. Minimal lumbar degenerative disk disease.  4. History of chronic renal disorder with kidney stones.   In the office today, we did refill the patient's oxycodone as of  December 20, 2008, and her Valium as of today.  We will plan on seeing  her in followup either with myself or with a nurse in 1 month's time.           ______________________________  Ellwood Dense, M.D.     DC/MedQ  D:  11/30/2008 10:51:05  T:  12/01/2008  01:11:31  Job #:  [id number]

## 2011-04-01 NOTE — Assessment & Plan Note (Signed)
[name] is back regarding her chronic pain.  Her knee did well with the  injection.  Orthopedics recommended some therapy to strengthen up her  leg musculature.  Left shoulder is bothering her again more near the  neck than the shoulder itself.  She is unable to tolerate Topamax due to  daytime sedation and she stopped this.  Opana made her nauseous.  She  reports constipation, but is not using anything regularly.  Pain is 6-8  out of 10.  She describe it as sharp, burning, stabbing, constant,  tingling, and aching.  Pain interferes with general activity,  relationship with others, enjoyment of life on a moderate-to-severe  level.  Sleep is poor at times.   The patient remains on her Percocet 7.5 one q.6 h. p.r.n.  She is on her  Mobic still 15 mg q.a.m.   REVIEW OF SYSTEMS:  Notable for multiple issues including some anxiety,  depression, numbness, tremor, night sweats, occasional elevated sugars,  nausea, limb swelling, and shortness of breath.  Full review is in the  written health and history section.  Other pertinent positives are  above.   SOCIAL HISTORY:  Essentially is unchanged.  She is still smoking, living  with her 3 children.   PHYSICAL EXAMINATION:  VITAL SIGNS:  Blood pressure is 118/67, pulse 82,  respiratory rate 18, and she is sating 100% on room air.  GENERAL:  The patient is pleasant, alert, and oriented x3.  MUSCULOSKELETAL:  She has fair gait and less antalgia to the right side  today.  She is limited with all movement in the low back, particularly  flexion.  Area around the operative site is tender to touch, but intact.  She has intact strength and reflexes in both lower extremities, 5/5 and  2+.  Upper extremity strength is also within normal limits.  She had  some mild pain with rightward neck bending and rotation to the right and  left.  Facet maneuvers were equivocal in the neck.  She had pain with  palpation over the middle of the trapezius extending from the  shoulder  to the C4-C5 area.  Rotator cuff and shoulder maneuvers were negative  for any pathology today.  HEART:  Regular rhythm.  CHEST:  Clear.  ABDOMEN:  Soft, nontender.  NEURO:  Cognitively, she is appropriate.   ASSESSMENT:  1. History of thoracolumbar scoliosis status post fusion surgery x2.  2. Cervical spondylosis with associated myofascial pain.  3. 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      Improved with injection.  Agreed with therapy plan.  4. Reactive depression.  5. Central sensitization syndrome.   PLAN:  1. We will try another anticonvulsant Topamax 25 mg nightly for 1      week, then 2tabs qhs. thereafter.  2. We will hold off on any long acting opioid at this point.  We did      refill Percocet 7.5/325 one q.6 h. p.r.n., #120.  Consider morphine      trial.  She really did not have an anaphylactic-type response with      morphine in the past.  Apparently, there was a bad response to IV      morphine which caused sedation postoperatively.  3. Encouraged TENS use to the neck and low back.  4. Additional therapy orders to address left trapezius myofascial      pain.  5. Encourage activity as much as possible.  6. I will see her back in  about 2 months with nurse clinic followup in      1 month's time.      Ranelle Oyster, M.D.  Electronically Signed     ZTS/MedQ  D:  04/10/2009 10:11:11  T:  04/11/2009 00:36:37  Job #:  [id number]   cc:   Dorita Fray  Fax: [phone]

## 2011-04-04 NOTE — Assessment & Plan Note (Signed)
[name] returns to the clinic today for followup evaluation.  She is  a 35 year old female who underwent extensive scoliosis surgery with Dr.  Elvina Sidle in [place] on November 19, 2006.  This includes the fusion T11-L5.  Since that time she has continued to have pain and she is not sure how  much benefit she gained from the surgery overall.  She understands that  there was no promise that she would be pain free after the surgery and  she is certainly is not pain free.   She continues to use different pain medicines.  During the last clinic  visit, December 02, 2006, we had added oxycodone sustained release 10 mg  q.12 h.  She reports that she stopped taking that as she was worried  about taking that after she discussed it with her pharmacist.  She has  been using the oxycodone 7.5/325 approximately 8 tablets per day.  She  reports that she continues to take the Valium approximately 3 times per  day but has stopped the trazodone medication as it caused her a  hangover.  She has also stopped the Pyridium medication.  She is due to  follow up with Dr. Elvina Sidle this coming Monday, January 04, 2007.   MEDICATIONS:  1. Diazepam 5 mg t.i.d. p.r.n.  2. Oxycodone 7.5/325, one to two tablets p.o. q.i.d. p.r.n.   REVIEW OF SYSTEMS:  Positive for chills along with constipation, poor  appetite, and sleep apnea.   PHYSICAL EXAMINATION:  GENERAL:  A reasonably well-appearing, young  adult female, ambulating without any assistive device.  VITAL SIGNS:  Blood pressure is 132/59 with a pulse of 103, respiratory  rate 16, and O2 saturation 100% on room air.  MUSCULOSKELETAL:  She has 5 minus over 5 strength at the bilateral upper  and 4/5 strength throughout the bilateral lower extremities.   IMPRESSION:  1. Moderate to severe lower thoracic to lumbar scoliosis.  2. Status post T11-L5 fusion for severe scoliosis.   In the office today:  1. We did refill the patient's oxycodone, a total of 240, taking 8   tablets per day on an as needed basis.  2. We also added Flexeril 10 mg, one tab p.o. q.h.s. to see if we      could help her with her sleep.  3. She continues to take the Valium but reports that she could not      tolerate the trazodone medication.  4. She is following up with Dr. Elvina Sidle this coming Monday.  5. We will plan on seeing her in followup in approximately 2 months'      time.           ______________________________  Ellwood Dense, M.D.     DC/MedQ  D:  01/01/2007 13:28:34  T:  01/01/2007 04:54:09  Job #:  [id number]

## 2011-04-04 NOTE — Assessment & Plan Note (Signed)
[name] returns to clinic today for follow-up evaluation.  She is a  35 year old female referred to this office by Dr. Elvina Sidle in [place] for  evaluation of chronic low back pain related to scoliosis.  I saw her in  this office October 19, 2006.  At that time there was a plan for  imminent surgery.  We gave her a prescription for Percocet 5/325 to be  used 1 tablet t.i.d. p.r.n.   The patient reports that she used that medication as instructed through  December into early January.  She subsequently has had fusion surgery  with Dr. Elvina Sidle dated November 19, 2006.  That was a fusion T11-L5.  She was  prescribed oxycodone sustained release, but her Medicaid would not fill  without paperwork.  Dr. Elvina Sidle did not complete the necessary paperwork,  according to the patient.  They did give her oxycodone 5 mg to be used 1-  2 tablets p.o. q. 4hours.  She reports that she has been taking 2  approximately 5-6 per day a total of 10-12 per day and getting only  minimal relief.  She feels that she got better relief when she was  taking the oxycodone 5/325 mg prescribed for her by myself.  She also  has been prescribed diazepam 5 mg and she takes that 2-3 times per day  for back spasms.  She does not have to wear a back brace, but does have  to follow up with Dr. Elvina Sidle in approximately 6 weeks' time.   MEDICATIONS:  1. Pyridium 100 mg 2 tablets t.i.d.  2. Trazodone 50 mg nightly.  3. Diazepam 5 mg t.i.d. p.r.n.  4. Oxycodone 5 mg 1-2 tablets p.o. q. 4hours (10-12 per day).   REVIEW OF SYSTEMS:  Positive for night sweats, high blood sugar, chills,  constipation, poor appetite.   PHYSICAL EXAMINATION:  An ill-appearing pained adult female lying on the  exam table in moderate to acute discomfort.  Blood pressure 110/59 with a pulse of 108, respiratory rate 18 and O2  saturation 100% on room air.  She is 5/5 strength throughout the bilateral upper extremities and 4/5  strength throughout the bilateral lower  extremities.   IMPRESSION:  1. Moderate-to-severe lower thoracic to lumbar scoliosis.  2. Status post T11-L5 fusion for severe scoliosis.   In the office today we did have a discussion regarding her medications.  I have refilled the Valium at 5 mg 1 tablet t.i.d. p.r.n. as of the 21st  of January.  We also started her on oxycodone sustained release 10 mg 1  tablet q. 12hours.  I will fill out the necessary paperwork to get that  paid for by Medicaid.  We have also changed her oxycodone 7.5/325 1-2  tablets p.o. q.i.d. p.r.n.  She needs to take the medication only as  absolutely necessary and get medication only from this office.  All that  has been reiterated with her in the office today.  Will plan on seeing  her in followup in approximately one month's time.  I will be filling  out the necessary paperwork for the sustained release oxycodone through  her Medicaid as necessary.           ______________________________  Ellwood Dense, M.D.     DC/MedQ  D:  12/02/2006 11:56:52  T:  12/02/2006 15:17:34  Job #:  [id number]

## 2011-04-04 NOTE — Group Therapy Note (Signed)
Monday, October 19, 2006   Purpose of Evaluation:  Evaluate and treat for chronic pain related to  scoliosis.   HISTORY OF PRESENT ILLNESS:  [name] is a 35 year old adult female  referred to this office by Dr. Elvina Sidle in [place] for evaluation and  treatment of chronic back pain related to scoliosis. The patient has a  history of scoliosis diagnosed in 2006. She started seeing Dr. Elvina Sidle Mar 24, 2006 in [place]. At that time, her scoliosis was measuring 35 degrees  from T10-L3 towards the left. She had been measuring 33 degrees as of  January 2007. She was prescribed Naprosyn b.i.d. along with a home  exercise program and an Aspen brace.   Mar 25, 2006, the patient underwent an MRI scan of her lumbar spine which  showed moderate levoconvex scoliosis centered at L2 level. There was  degenerative disc disease in the lower lumbar spine, worse at L5-S1  level where the posterior lateral disc bulge resulted in mild right  neural foraminal narrowing.   The patient was seen in followup July 17, 2006. She was experiencing  low back along with left leg pain and it was difficult for her to walk  at times. Epidural steroid injections had been tried in the past without  significant improvement. The diagnosis was idiopathic scoliosis,  kyphosis and lumbar degenerative disc disease. Plane films showed 36  degrees of scoliosis T10-L3 with 59 degrees of kyphosis and 84 degrees  of lordosis. There is retrolisthesis at L5-S1. The patient was started  on Chantix for smoking cessation prior to questionable surgery. She was  to continue in therapy along with an Aspen brace.   September 24, 2006, the patient was seen by the PA, Ms. Tiburcio Pea in Dr.  Layla Maw office. She was prescribed Motrin 800 mg t.i.d. along with  Lidoderm patches. She was also using BC powders. She was unable to take  the Chantix secondary to kidney problems. X-rays at that time showed 43  degrees of scoliosis T10-L3 with the lateral view  showing 71 degrees of  lordosis. At that time, the patient was noted to meet all three criteria  for surgery, including scoliosis greater than 40 degrees, progressive  curve and progressive pain.   The patient has been told that she is being set up for surgery for her  scoliosis as of January or February 2008. She has been told by Dr. Elvina Sidle  that he needs her off tobacco completely at that point.   Dr. Elvina Sidle apparently is not interested in prescribing pain medicines for  this individual, other than antiinflammatory medications. She has  contacted her primary care physician, Dr. Christell Faith, and Dr. Christell Faith had  apparently done a Toradol injection for the patient. She was prescribed  only antiinflammatory medications and Lidoderm patches with Dr. Elvina Sidle.   Presently the patient reports that she has pain in mid-lumbar region and  upper thoracic region. She reports that the pain is present most of the  time, and was relieved when she used a friend's Percocet this past  weekend. She has had no relief from Ultram which she has used in the  past. No other significant pain medicines have been used by this  individual.   PAST MEDICAL HISTORY:  1. History of medullary sponge kidney disease.  2. History of anxiety/panic attacks.  3. Prior C-section x3.   FAMILY HISTORY:  Positive for scoliosis in her mother and father.   ALLERGIES:  INTOLERANCE OF SULFA AND MORPHINE.   SOCIAL  HISTORY:  The patient is divorced and presently living with her  boyfriend. She smokes approximately one pack of cigarettes per day with  a maximum use of two packs of cigarettes per day. She has three  children, ages 63, five and four. She reports social alcohol intake.  She has applied for social security disability, but has been denied. She  is in the process of reapplying with the assistance of an attorney.   REVIEW OF SYSTEMS:  Positive for chills, constipation, poor appetite,  painful urination.   MEDICATIONS:  1.  Pyridium 100 mg 2 tablets t.i.d.  2. Trazodone 50 mg 1 tablet every evening.  3. Lidoderm patch 5% p.r.n.  4. Xanax 0.5 mg b.i.d. p.r.n.   PHYSICAL EXAMINATION:  Well-appearing fit adult female in mild acute  discomfort. Blood pressure 100/65 with pulse of 93, respiratory rate 16,  and O2 saturation 99% on room air. Height was 5 feet 2 inches, weight  120 pounds. The patient was able to ambulate without any assisted  device. She was able to toe walk and heel walk bilaterally. Upper  extremity range of motion was full and pain-free. Upper extremity  strength was 5-/5 throughout. Bulk and tone were normal. Reflexes were  2+ and symmetrical. Lower extremity examination showed normal bulk and  tone throughout. Reflexes were 2+ in the bilateral upper and lower  extremities. Lower extremity exam showed 5-/5 strength in hip flexor,  knee extension and ankle dorsiflexion. Sensation was intact to light  touch throughout the bilateral upper and lower extremities.   In the same position, lumbar range of motion was within functional  limits. She did have a moderate-to-severe scoliosis towards the left on  forward flexion.   IMPRESSION:  Moderate-to-severe lower thoracic-to-lumbar scoliosis, most  recently measuring 43 degrees as of September 24, 2006.   Presently the patient has significant progressive scoliosis, along with  lordosis and kyphosis. She is planning to have surgery with Dr. Elvina Sidle  approximately January or February 2008. In the meantime, she has a fair  amount of pain which has not been adequately treated. We have given her  a prescription for Percocet 5/325 to be used 1 tablet t.i.d. p.r.n. I  asked her to use this only when absolutely necessary. She understands  all the rules that need to be complied with to continue pain medicines  through this office. We will plan on seeing the patient in followup in approximately one month's time to see how she is doing and adjust  medicines as  needed. I again encouraged her to stop tobacco gradually  over the next several weeks leading up to her planned surgery.   The patient is on Pyridium for reported urinary tract infection  symptoms. We have obtained a urine drug screen in the office today,  along with a urinalysis and urine culture. We will contact her regarding  the results of the urine culture and results of the urine drug screen as  appropriate. We will plan on seeing the patient in followup in  approximately one month's time.           ______________________________  Ellwood Dense, M.D.     DC/MedQ  D:  10/19/2006 15:26:58  T:  10/20/2006 11:03:39  Job #:  [id number]

## 2011-04-04 NOTE — Assessment & Plan Note (Signed)
FOLLOWUP OFFICE NOTE   [name] returns to clinic today for followup evaluation.  She is a  35 year old female with history of lumbar scoliosis.  She underwent  recent extensive scoliosis surgery with Dr. Elvina Sidle in [facility] November 19, 2006.  She continues to slowly improve.  She is requested by Dr. Elvina Sidle to  walk on a regular basis and try to get into either an indoor or outdoor  pool for pool therapy as available.  She did follow up with Dr. Elvina Sidle this  past Friday.  They are planning to do a possible MRI scan of the right  leg and lumbar spine if she continues to have increased numbness on that  side.   In terms of her pain medicines, she does use the oxycodone 7.5/325, but  uses anywhere from 4 to 8 tablets per day.  She just ran out of a script  dated from January 01, 2007.  She does use the Flexeril occasionally at  night, but reports nightmares with the medication.  She is not using the  Valium at all at this point.   MEDICATIONS:  1. Oxycodone 7.5/325 one tablet p.o. q.i.d. p.r.n. (4 to 8 per day).  2. Flexeril 10 mg p.o. q.h.s. p.r.n.   REVIEW OF SYSTEMS:  Noncontributory.   PHYSICAL EXAMINATION:  GENERAL:  Well-appearing young adult female  ambulating without any assistive device.  VITAL SIGNS:  Blood pressure 117/74, pulse 89, respiratory rate 16, O2  saturation 100% on room air.  MUSCULOSKELETAL:  She has 5-/5 strength in the bilateral upper  extremities and 4/5 strength in bilateral lower extremities.   IMPRESSION:  Moderate to severe lower thoracic to lumbar scoliosis  status post T11-L5 fusion for severe scoliosis.   In the office today we did refill the patient's oxycodone 7.5/325.  I  have asked her to use only the number absolutely necessary for her pain  relief.  We may plan on switching her from oxycodone to Ultram in the  future and on a gradual basis.  We will plan on seeing her in followup  in approximately two months' time.  No refill on the Flexeril is  necessary at this point.  I have encouraged her to try to remain as  active as possible, including the walking and water therapy that was  suggested by her surgeon.           ______________________________  Ellwood Dense, M.D.     DC/MedQ  D:  02/26/2007 13:38:36  T:  02/26/2007 14:06:04  Job #:  [id number]

## 2011-04-07 ENCOUNTER — Encounter: Payer: Medicare Other | Attending: Neurosurgery | Admitting: Neurosurgery

## 2011-04-07 ENCOUNTER — Ambulatory Visit: Payer: Medicare Other | Admitting: Neurosurgery

## 2011-04-07 DIAGNOSIS — M542 Cervicalgia: Secondary | ICD-10-CM | POA: Insufficient documentation

## 2011-04-07 DIAGNOSIS — F329 Major depressive disorder, single episode, unspecified: Secondary | ICD-10-CM | POA: Insufficient documentation

## 2011-04-07 DIAGNOSIS — M4120 Other idiopathic scoliosis, site unspecified: Secondary | ICD-10-CM | POA: Insufficient documentation

## 2011-04-07 DIAGNOSIS — M961 Postlaminectomy syndrome, not elsewhere classified: Secondary | ICD-10-CM

## 2011-04-07 DIAGNOSIS — F3289 Other specified depressive episodes: Secondary | ICD-10-CM | POA: Insufficient documentation

## 2011-04-07 DIAGNOSIS — M546 Pain in thoracic spine: Secondary | ICD-10-CM | POA: Insufficient documentation

## 2011-04-07 DIAGNOSIS — G894 Chronic pain syndrome: Secondary | ICD-10-CM

## 2011-04-08 NOTE — Assessment & Plan Note (Signed)
Mr. [name] is a patient of Dr. Riley Kill who has been seen for sometime for chronic pain problems.  She has had a history of thoracolumbar scoliosis with fusion.  The patient also has some cervical problems and myofascial pain.  She today rates her pain about 6 or 7, sharp and burning, constant and aching.  Her activity level is about 6 or 7 in the morning and evening.  Night, her pain is worse.  Sleep patterns are fair.  All activities aggravate her pain.  Rest and heat, medication tend to help with occasional injections.  She describes her pain is upper thoracic and cervical and paraspinous cervical musculature down the left leg.  Mobility, she walks without assistance.  She can walk to about 10-15 with no problems.  She does drive.  She does not climb stairs.  She is not employed.  REVIEW OF SYSTEMS:  Notable for those difficulties as well as night sweats, nausea, constipation, limb swelling, urinary retention, some abdominal pain, numbness and tingling, spasms, depression, anxiety.  SOCIAL HISTORY:  She is divorced.  She lives with her children.  Physical exam; blood pressure today was 110/70, her pulse was at 105, respirations are 20, O2 sat was 97 on room air.  She is alert and oriented x3.  Her affect is bright and alert.  Constitutional, she is within normal limits.  She is somewhat tender in the lumbar spine to the cervical spine up along the left side of the paraspinous musculature in the cervical area. Her sensation is intact.  She can flex to 90 degrees, extend to about 5 degrees with pain.  Her gait appears to be normal although she does stand somewhat tilted to the left.  ASSESSMENT: 1. Thoracolumbar scoliosis with corrective fusion. 2. Cervical spondylosis. 3. Depression. 4. Myofascial pain.  PLAN: 1. She is asked for some myofascial injections.  We will go ahead and     do that for her day. 2. We refilled her Opana 30 mg 1 p.o. 12 hours 60 with no refill and  oxycodone 7.5/325 one p.o. q.6 h. 120 with no refill. 3. She will continue to talk to Dr. Carolynne Edouard about possible corrective     surgery in the future. 4. She will see [place] back in the clinic in 1 month.  Her questions are     encouraged and answered.     [name] Electronically Signed    RLW/MedQ D:  04/07/2011 14:41:22  T:  04/08/2011 69:62:95  Job #:  [id number]

## 2011-04-08 NOTE — Procedures (Signed)
NAMEMINDEE, ROBLEDO                ACCOUNT NO.:  [id number]  MEDICAL RECORD NO.:  [id number]           PATIENT TYPE:  O  LOCATION:  PRM                          FACILITY:  [facility]  PHYSICIAN:  Jari Dipasquale L. Webb Silversmith, ANP-CDATE OF BIRTH:  10-09-1976  DATE OF PROCEDURE:  04/07/2011 DATE OF DISCHARGE:                              OPERATIVE REPORT  Ms. Hight is a patient of Dr. Riley Kill seen for myofascial pain.  Today, she is point tender down her paraspinous musculature in the cervical spine as well as thoracic areas.  She is most tender to the left of the cervical spine and the scapular area radiating upwards.  PROCEDURE:  After drying 2 mL of lidocaine, Betadine prep to the left side of her cervical spine and the upper scapular area and injected 1 mL in her paraspinous musculature in 2 locations about 10 mm apart.  She tolerated it well.  We will see her back in the clinic in 1 month.  Her questions were encouraged and answered.  Informed consent has to be obtained.     Dajsha Massaro L. Blima Dessert Electronically Signed    RLW/MEDQ  D:  04/07/2011 14:43:33  T:  04/08/2011 02:20:35  Job:  [id number]

## 2011-05-08 ENCOUNTER — Encounter: Payer: Medicare Other | Attending: Physical Medicine & Rehabilitation

## 2011-05-08 DIAGNOSIS — IMO0001 Reserved for inherently not codable concepts without codable children: Secondary | ICD-10-CM | POA: Insufficient documentation

## 2011-05-08 DIAGNOSIS — F329 Major depressive disorder, single episode, unspecified: Secondary | ICD-10-CM | POA: Insufficient documentation

## 2011-05-08 DIAGNOSIS — M961 Postlaminectomy syndrome, not elsewhere classified: Secondary | ICD-10-CM

## 2011-05-08 DIAGNOSIS — M47812 Spondylosis without myelopathy or radiculopathy, cervical region: Secondary | ICD-10-CM | POA: Insufficient documentation

## 2011-05-08 DIAGNOSIS — M47817 Spondylosis without myelopathy or radiculopathy, lumbosacral region: Secondary | ICD-10-CM

## 2011-05-08 DIAGNOSIS — F3289 Other specified depressive episodes: Secondary | ICD-10-CM | POA: Insufficient documentation

## 2011-05-08 DIAGNOSIS — M4130 Thoracogenic scoliosis, site unspecified: Secondary | ICD-10-CM | POA: Insufficient documentation

## 2011-05-08 DIAGNOSIS — M4120 Other idiopathic scoliosis, site unspecified: Secondary | ICD-10-CM

## 2011-05-30 ENCOUNTER — Other Ambulatory Visit (HOSPITAL_COMMUNITY): Payer: Self-pay | Admitting: Orthopedic Surgery

## 2011-05-30 DIAGNOSIS — Z981 Arthrodesis status: Secondary | ICD-10-CM

## 2011-06-02 ENCOUNTER — Ambulatory Visit (HOSPITAL_COMMUNITY)
Admission: RE | Admit: 2011-06-02 | Discharge: 2011-06-02 | Disposition: A | Discharge status: Home / Self Care | Payer: Medicare Other | Source: Ambulatory Visit | Attending: Orthopedic Surgery | Admitting: Orthopedic Surgery

## 2011-06-02 DIAGNOSIS — M545 Low back pain, unspecified: Secondary | ICD-10-CM | POA: Insufficient documentation

## 2011-06-02 DIAGNOSIS — Z981 Arthrodesis status: Secondary | ICD-10-CM | POA: Insufficient documentation

## 2011-06-09 ENCOUNTER — Encounter: Payer: Medicare Other | Attending: Physical Medicine & Rehabilitation | Admitting: Physical Medicine & Rehabilitation

## 2011-06-09 DIAGNOSIS — F17200 Nicotine dependence, unspecified, uncomplicated: Secondary | ICD-10-CM | POA: Insufficient documentation

## 2011-06-09 DIAGNOSIS — M47814 Spondylosis without myelopathy or radiculopathy, thoracic region: Secondary | ICD-10-CM | POA: Insufficient documentation

## 2011-06-09 DIAGNOSIS — M47812 Spondylosis without myelopathy or radiculopathy, cervical region: Secondary | ICD-10-CM | POA: Insufficient documentation

## 2011-06-09 DIAGNOSIS — T84498A Other mechanical complication of other internal orthopedic devices, implants and grafts, initial encounter: Secondary | ICD-10-CM | POA: Insufficient documentation

## 2011-06-09 DIAGNOSIS — M79609 Pain in unspecified limb: Secondary | ICD-10-CM | POA: Insufficient documentation

## 2011-06-09 DIAGNOSIS — M47817 Spondylosis without myelopathy or radiculopathy, lumbosacral region: Secondary | ICD-10-CM

## 2011-06-09 DIAGNOSIS — Y831 Surgical operation with implant of artificial internal device as the cause of abnormal reaction of the patient, or of later complication, without mention of misadventure at the time of the procedure: Secondary | ICD-10-CM | POA: Insufficient documentation

## 2011-06-09 DIAGNOSIS — IMO0001 Reserved for inherently not codable concepts without codable children: Secondary | ICD-10-CM

## 2011-06-09 DIAGNOSIS — M4120 Other idiopathic scoliosis, site unspecified: Secondary | ICD-10-CM

## 2011-06-09 DIAGNOSIS — M961 Postlaminectomy syndrome, not elsewhere classified: Secondary | ICD-10-CM

## 2011-06-09 DIAGNOSIS — Z981 Arthrodesis status: Secondary | ICD-10-CM | POA: Insufficient documentation

## 2011-06-09 NOTE — Assessment & Plan Note (Signed)
[name] is back regarding her back pain.  She now set up for surgery I believe on August 7.  It sounds as if Dr. Alveda Reasons, will use an anterior approach for revision and fusion surgery.  Her pain 6-7/10 and generally stable but inhibiting for most her daily activities of life.  Sleep is poor.  Pain is sharp stabbing, aching, constant.  She finds that the Opana as helpful still but newer formulation does not seem to be as beneficial as the old.  She is moving her bowels and bladder.  REVIEW OF SYSTEMS:  Notable for the above.  She does have occasional spasms, anxiety.  Trigger-point injections have been helpful for some of her pain in the past also.  SOCIAL HISTORY:  The patient is divorced, living with her children.  She still smoking a pack of cigarettes per day.  PHYSICAL EXAMINATION:  Blood pressure is 113/68, pulse 98, respiratory rate 18, and she is satting 100% on room air.  She is pleasant, alert. She still has poor standing posture.  Weight is stable.  She has elevation in the right hemipelvis and level scoliosis of the thoracal lumbar spine.  She limited to about 6 degrees of extension flexion with significant pain.  She has not 10 to 50 degrees of extension.  Strength is generally 4-5/5 with pain inhibition proximally.  ASSESSMENT: 1. Thoracolumbar spondylosis. 2. Scoliosis with confusion persistent axial and leg pain.  She has     fracture around the hardware and loosening of hardware by report. 3. Myofascial pain and cervical spondylosis.  PLAN: 1. We will stay with Opana ER 30 q. 12 hours and oxycodone 7.5 for     breakthrough pain.  She may need titration of these upwards     postoperatively. 2. Continue exercises and stretching to tolerance for now. 3. The patient back here in 3 months with me.  However, she will see     my nurse practitioner back in a month if she is ready to leave the     house at that point.     Ranelle Oyster, M.D. Electronically  Signed    ZTS/MedQ D:  06/09/2011 11:13:16  T:  06/09/2011 16:32:23  Job #:  [id number]

## 2011-06-20 ENCOUNTER — Other Ambulatory Visit (HOSPITAL_COMMUNITY): Payer: Self-pay | Admitting: Orthopedic Surgery

## 2011-06-20 ENCOUNTER — Ambulatory Visit (HOSPITAL_COMMUNITY)
Admission: RE | Admit: 2011-06-20 | Discharge: 2011-06-20 | Disposition: A | Discharge status: Home / Self Care | Payer: Medicare Other | Source: Ambulatory Visit | Attending: Orthopedic Surgery | Admitting: Orthopedic Surgery

## 2011-06-20 ENCOUNTER — Encounter (HOSPITAL_COMMUNITY)
Admission: RE | Admit: 2011-06-20 | Discharge: 2011-06-20 | Disposition: A | Discharge status: Home / Self Care | Payer: Medicare Other | Source: Ambulatory Visit | Attending: Orthopedic Surgery | Admitting: Orthopedic Surgery

## 2011-06-20 DIAGNOSIS — Z01812 Encounter for preprocedural laboratory examination: Secondary | ICD-10-CM | POA: Insufficient documentation

## 2011-06-20 DIAGNOSIS — S32009K Unspecified fracture of unspecified lumbar vertebra, subsequent encounter for fracture with nonunion: Secondary | ICD-10-CM

## 2011-06-20 DIAGNOSIS — IMO0002 Reserved for concepts with insufficient information to code with codable children: Secondary | ICD-10-CM | POA: Insufficient documentation

## 2011-06-20 DIAGNOSIS — Z01810 Encounter for preprocedural cardiovascular examination: Secondary | ICD-10-CM | POA: Insufficient documentation

## 2011-06-20 DIAGNOSIS — Z01818 Encounter for other preprocedural examination: Secondary | ICD-10-CM | POA: Insufficient documentation

## 2011-06-20 LAB — DIFFERENTIAL
Basophils Relative: 0 % (ref 0–1)
Eosinophils Relative: 3 % (ref 0–5)
Monocytes Absolute: 0.8 10*3/uL (ref 0.1–1.0)
Monocytes Relative: 9 % (ref 3–12)
Neutro Abs: 5.3 10*3/uL (ref 1.7–7.7)

## 2011-06-20 LAB — COMPREHENSIVE METABOLIC PANEL
Albumin: 3.4 g/dL — ABNORMAL LOW (ref 3.5–5.2)
Alkaline Phosphatase: 56 U/L (ref 39–117)
BUN: 10 mg/dL (ref 6–23)
CO2: 29 mEq/L (ref 19–32)
Chloride: 106 mEq/L (ref 96–112)
Creatinine, Ser: 0.6 mg/dL (ref 0.50–1.10)
GFR calc Af Amer: >60 mL/min (ref >60)
GFR calc non Af Amer: >60 mL/min (ref >60)
Glucose, Bld: 67 mg/dL — ABNORMAL LOW (ref 70–99)
Potassium: 3.7 mEq/L (ref 3.5–5.1)
Total Bilirubin: 0.1 mg/dL — ABNORMAL LOW (ref 0.3–1.2)

## 2011-06-20 LAB — TYPE AND SCREEN: Antibody Screen: NEGATIVE

## 2011-06-20 LAB — SURGICAL PCR SCREEN
MRSA, PCR: NEGATIVE
Staphylococcus aureus: NEGATIVE

## 2011-06-20 LAB — CBC
HCT: 38.4 % (ref 36.0–46.0)
Hemoglobin: 12.8 g/dL (ref 12.0–15.0)
MCH: 31.7 pg (ref 26.0–34.0)
MCHC: 33.3 g/dL (ref 30.0–36.0)

## 2011-06-20 LAB — URINALYSIS, ROUTINE W REFLEX MICROSCOPIC
Bilirubin Urine: NEGATIVE
Leukocytes, UA: NEGATIVE
Nitrite: NEGATIVE
Urobilinogen, UA: 0.2 mg/dL (ref 0.0–1.0)
pH: 5.5 (ref 5.0–8.0)

## 2011-06-20 LAB — PROTIME-INR
INR: 0.98 (ref 0.00–1.49)
Prothrombin Time: 13.2 seconds (ref 11.6–15.2)

## 2011-06-20 LAB — HCG, SERUM, QUALITATIVE: Preg, Serum: NEGATIVE

## 2011-06-24 ENCOUNTER — Inpatient Hospital Stay (HOSPITAL_COMMUNITY)
Admission: RE | Admit: 2011-06-24 | Discharge: 2011-06-26 | DRG: 460 | Disposition: A | Discharge status: Home / Self Care | Payer: Medicare Other | Source: Ambulatory Visit | Attending: Orthopedic Surgery | Admitting: Orthopedic Surgery

## 2011-06-24 ENCOUNTER — Inpatient Hospital Stay (HOSPITAL_COMMUNITY): Payer: Medicare Other

## 2011-06-24 ENCOUNTER — Other Ambulatory Visit: Payer: Self-pay | Admitting: Orthopedic Surgery

## 2011-06-24 DIAGNOSIS — Y831 Surgical operation with implant of artificial internal device as the cause of abnormal reaction of the patient, or of later complication, without mention of misadventure at the time of the procedure: Secondary | ICD-10-CM | POA: Diagnosis present

## 2011-06-24 DIAGNOSIS — Z981 Arthrodesis status: Secondary | ICD-10-CM

## 2011-06-24 DIAGNOSIS — K219 Gastro-esophageal reflux disease without esophagitis: Secondary | ICD-10-CM | POA: Diagnosis present

## 2011-06-24 DIAGNOSIS — Z472 Encounter for removal of internal fixation device: Secondary | ICD-10-CM

## 2011-06-24 DIAGNOSIS — T84498A Other mechanical complication of other internal orthopedic devices, implants and grafts, initial encounter: Principal | ICD-10-CM | POA: Diagnosis present

## 2011-06-24 DIAGNOSIS — Z87442 Personal history of urinary calculi: Secondary | ICD-10-CM

## 2011-06-24 DIAGNOSIS — Z87440 Personal history of urinary (tract) infections: Secondary | ICD-10-CM

## 2011-06-24 DIAGNOSIS — F17200 Nicotine dependence, unspecified, uncomplicated: Secondary | ICD-10-CM | POA: Diagnosis present

## 2011-06-24 DIAGNOSIS — R059 Cough, unspecified: Secondary | ICD-10-CM | POA: Diagnosis present

## 2011-06-24 DIAGNOSIS — R05 Cough: Secondary | ICD-10-CM | POA: Diagnosis present

## 2011-06-26 LAB — CBC
Platelets: 227 10*3/uL (ref 150–400)
RBC: 2.95 MIL/uL — ABNORMAL LOW (ref 3.87–5.11)
WBC: 15.6 10*3/uL — ABNORMAL HIGH (ref 4.0–10.5)

## 2011-06-26 LAB — BASIC METABOLIC PANEL
CO2: 28 mEq/L (ref 19–32)
Chloride: 108 mEq/L (ref 96–112)
Sodium: 140 mEq/L (ref 135–145)

## 2011-06-26 LAB — DIFFERENTIAL
Basophils Relative: 0 % (ref 0–1)
Eosinophils Absolute: 0.3 10*3/uL (ref 0.0–0.7)
Lymphs Abs: 2 10*3/uL (ref 0.7–4.0)
Neutrophils Relative %: 74 % (ref 43–77)

## 2011-07-07 ENCOUNTER — Encounter: Payer: Medicare Other | Attending: Neurosurgery | Admitting: Neurosurgery

## 2011-07-07 DIAGNOSIS — M542 Cervicalgia: Secondary | ICD-10-CM | POA: Insufficient documentation

## 2011-07-07 DIAGNOSIS — M549 Dorsalgia, unspecified: Secondary | ICD-10-CM | POA: Insufficient documentation

## 2011-07-07 DIAGNOSIS — G8928 Other chronic postprocedural pain: Secondary | ICD-10-CM

## 2011-07-07 DIAGNOSIS — M47812 Spondylosis without myelopathy or radiculopathy, cervical region: Secondary | ICD-10-CM | POA: Insufficient documentation

## 2011-07-07 DIAGNOSIS — Z981 Arthrodesis status: Secondary | ICD-10-CM | POA: Insufficient documentation

## 2011-07-07 DIAGNOSIS — M961 Postlaminectomy syndrome, not elsewhere classified: Secondary | ICD-10-CM

## 2011-07-07 DIAGNOSIS — R209 Unspecified disturbances of skin sensation: Secondary | ICD-10-CM | POA: Insufficient documentation

## 2011-07-07 DIAGNOSIS — M4120 Other idiopathic scoliosis, site unspecified: Secondary | ICD-10-CM | POA: Insufficient documentation

## 2011-07-08 NOTE — Assessment & Plan Note (Signed)
This is a patient of Dr. Riley Kill seen for back pain.  She just underwent anterior approach fusion and revision by Dr. Alveda Reasons 2 weeks ago.  She still has a Dermabond in place.  She has to move out of her wounds and it is too early for the scar medication on that would help with the scarring.  I told her it is absolutely too early.  She needs to wait until she is well healed and she understands that.  She is here with her brother.  She is unable to drive, but she does not a back brace on.  She rates her pain 7 or 8.  It is a sharp pain that is intermittent, stabbing. Paresthesias that are constant.  General activity level is 6- 8, pain is worse in the morning even in night.  Sleep patterns are poor. Pain is worse with walking, bending, standing, and activity.  Rest, medication, injections tend to help.  She can walk without assistance. She does use a walker right now, but she is independent today.  She does not climb steps or drive.  She can only walk about 5 minutes at a time. She needs help with transfer.  She is on disability, needs help with all ADLs.  REVIEW OF SYSTEMS:  Notable for those difficulties as well as some bladder control issues, numbness, tingling, trouble with walking, spasms, anxiety.  No suicidal thoughts.  Her Oswestry score is 64.  She has trouble with constipation, urinary retention, limb swelling.  PAST MEDICAL HISTORY:  Unchanged.  SOCIAL HISTORY:  She lives with her kids and cousins.  She is divorced.  FAMILY HISTORY:  Significant for heart disease, lung disease, diabetes.  PHYSICAL EXAMINATION:  VITAL SIGNS:  Blood pressure 126/74, pulse 115, respirations 18, O2 sats 100 on room air. NEUROLOGIC:  Motor strength is not tested due to a recent surgery. Sensation intact.  Constitutionally, she is within normal limits.  She is alert and oriented x3.  She walks with a limp.  She did show me her anterior wounds, which have Dermabond in place.  She needs to remove  that otherwise other care will be directed by Dr. Alveda Reasons. She states she did get some oxycodone from Dr. Alveda Reasons, which she is out of now.  She is currently out of her medicines for approximately 2-3 days early.  She was warned against being out early and low pill count. She stated understanding.  ASSESSMENT: 1. Status post anterior approach, L5-S1 fusion. 2. History of scoliosis with constant radicular pain. 3. Myofascial pain, cervical spondylosis.  PLAN: 1. We will refill the Opana ER 30 mg 1 p.o. 12 hours, 60, with no     refill. 2. Oxycodone 7.5/325 one p.o. q.6 h p.r.n., 120 with no refill. 3. She will follow up here in the clinic in 1 month.  Her questions     were encouraged and answered.     Yale Golla L. Blima Dessert Electronically Signed    RLW/MedQ D:  07/07/2011 14:06:43  T:  07/08/2011 00:51:50  Job #:  [id number]

## 2011-07-15 ENCOUNTER — Other Ambulatory Visit: Payer: Self-pay | Admitting: Medical

## 2011-07-24 ENCOUNTER — Encounter: Payer: Medicare Other | Attending: Neurosurgery | Admitting: Neurosurgery

## 2011-07-24 DIAGNOSIS — G8918 Other acute postprocedural pain: Secondary | ICD-10-CM | POA: Insufficient documentation

## 2011-07-24 DIAGNOSIS — M62838 Other muscle spasm: Secondary | ICD-10-CM | POA: Insufficient documentation

## 2011-07-24 DIAGNOSIS — G8928 Other chronic postprocedural pain: Secondary | ICD-10-CM

## 2011-07-24 DIAGNOSIS — Z981 Arthrodesis status: Secondary | ICD-10-CM | POA: Insufficient documentation

## 2011-07-24 DIAGNOSIS — R209 Unspecified disturbances of skin sensation: Secondary | ICD-10-CM | POA: Insufficient documentation

## 2011-07-24 DIAGNOSIS — M47812 Spondylosis without myelopathy or radiculopathy, cervical region: Secondary | ICD-10-CM | POA: Insufficient documentation

## 2011-07-24 DIAGNOSIS — IMO0001 Reserved for inherently not codable concepts without codable children: Secondary | ICD-10-CM | POA: Insufficient documentation

## 2011-07-24 DIAGNOSIS — M961 Postlaminectomy syndrome, not elsewhere classified: Secondary | ICD-10-CM

## 2011-07-24 DIAGNOSIS — M549 Dorsalgia, unspecified: Secondary | ICD-10-CM | POA: Insufficient documentation

## 2011-07-24 NOTE — Assessment & Plan Note (Signed)
HISTORY OF PRESENT ILLNESS:  This is a patient of Dr. Riley Kill seen for back pain.  She has bilateral lower extremity radiculopathy, now she just underwent anterior approach fusion revision by Dr. Alveda Reasons about 6 weeks ago.  She has well-healed from the surgical wounds but is having lot of radicular pain.  She states she followed up with Dr. Alveda Reasons last week and he told her that would be normal after the manipulation of her hip bone as well as the surgery itself and to follow up with [place] regarding any medicines.  She rates her pain at 6-8.  It is a sharp to dull stabbing, tingling, aching pain that is constant.  General activity level is 6-8.  Pain is worse in the morning, evening, and night.  Sleep patterns are poor.  All activities aggravate.  Rest, medication, and injections tend to help.  She walks without assistance today.  She is independent.  She states she does walk with assistance at times.  She can only walk about 5 minutes at a time.  She does not climb steps.  She does drive.  Right now, she needs help with transfers.  She is on disability and needs help with all household duties and meal prep.  REVIEW OF SYSTEMS:  Notable for difficulties as described above as well as some night sweats, fever, chills, poor appetite, urinary retention, limb swelling, bladder control issues, weakness, numbness, tremors, trouble with ambulation, spasm, depression, anxiety.  No suicidal thoughts or aberrant behaviors.  PAST MEDICAL HISTORY:  Unchanged.  SOCIAL HISTORY:  She is divorced, lives with her kids.  FAMILY HISTORY:  Unchanged.  PHYSICAL EXAMINATION:  VITAL SIGNS:  Blood pressure is 127/72, pulse 109, respirations 18, and O2 sats 97 on room air. NEUROLOGIC:  Her motor strength to confrontation testing in iliopsoas and quadriceps is 5/5 in lower extremities.  Sensation is intact. Constitutionally, she is within normal limits.  She is alert and oriented x3.  ASSESSMENT: 1. Status post  anterior approach L5-S1 fusion. 2. History of scoliosis with constant radicular pain. 3. Myofascial pain, cervical spondylosis. 4. Acute on chronic postoperative pain.  PLAN: 1. Refill Opana ER 30 mg 1 p.o. q.12 hours, 60 with no refill. 2. Oxycodone 7.5/325 one p.o. q.6 h. p.r.n. 120 with no refill. 3. Lyrica.  We are going to start a trial of 50 mg, one at 10 o'clock     in the morning and one at 6:00 p.m. to see if that will help with     her radicular symptoms.  She is in agreement with this.  We will     see her back here in clinic in 1 month.     [name] Electronically Signed    RLW/MedQ D:  07/24/2011 11:39:59  T:  07/24/2011 14:54:52  Job #:  [id number]

## 2011-07-31 ENCOUNTER — Ambulatory Visit: Payer: Medicare Other | Admitting: Neurosurgery

## 2011-08-06 NOTE — Op Note (Signed)
[name], [name]                ACCOUNT NO.:  [id number]  MEDICAL RECORD NO.:  [id number]  LOCATION:  5002                         FACILITY:  [facility]  PHYSICIAN:  Nelda Severe, MD      DATE OF BIRTH:  1976/09/21  DATE OF PROCEDURE:  06/24/2011 DATE OF DISCHARGE:                              OPERATIVE REPORT   SURGEON:  Nelda Severe, MD  ASSISTANT:  Lianne Cure, PA-C  PREOPERATIVE DIAGNOSIS:  L5-S1 pseudoarthrosis status post thoracolumbar fusion with L5-S1 transforaminal interbody fusion and cage.  POSTOPERATIVE DIAGNOSIS:  L5-S1 pseudoarthrosis status post thoracolumbar fusion with L5-S1 transforaminal interbody fusion and cage.  OPERATIVE PROCEDURE: 1. Harvest left anterior iliac crest graft. 2. Remove loose unincorporated cage from L5-S1 disk space. 3. L5-S1 interbody fusion (anterior) with autogenous iliac crest     graft, Titan cage with fixation screws.  OPERATIVE NOTE:  The patient was placed under general endotracheal anesthesia.  Foley catheter was placed in the bladder.  Sequential compression devices were placed on both lower extremities.  A pulse oximeter was placed on the left great toe to monitor blood flow to the left lower extremity.  Intravenous antibiotics were infused.  She was positioned supine on a radiolucent Jean Rosenthal table.  The lower extremities were supported on pillows.  The arms were abducted and placed on arm boards.  The C-arm fluoroscopy unit was brought into position and a lateral fluoroscopic view taken.  A radio-opaque instrument was used to project the plane of the L5-S1 disk onto the anterior abdominal wall and the line, the lower extremity incision, was placed on the abdominal wall with a skin marker.  The abdomen and left iliac crest were then prepped with DuraPrep and draped in rectangular fashion and the drapes were secured with Ioban.  A time-out was held at which point the usual parameters were discussed and  confirmed.  We started by harvesting the left anterior iliac crest graft.  A longitudinal incision, about 2-3 cm in length just distal to the iliac crest and more than 2 inches posterior to the anterior superior iliac spine was made.  The incision was then retracted proximally so that the deepening of the incision brought [place] down on the outer part of the superior surface of the iliac crest.  The external oblique interosseous was dissected off the iliac crest subperiosteally.  This, then allowed [place] to visualize the superior surface of the iliac crest.  A trocar was used to make a small hole in the iliac crest and then a bone retaining disposable bone harvesting device used to remove satisfactory quantity of bone graft.  The wound was then irrigated with antibiotic solution and the hole in the superior crest filled with Gelfoam to prevent bleeding into the soft tissues.  External oblique aponeurosis was then reattached with one figure-of-eight suture to the gluteal fascia.  The wound was closed later when the entire procedure was finished.  We then made a left lower quadrant oblique incision with its distal extent at the previously mentioned line, determined by cross-table lateral fluoroscopy.  Dissection was carried down to the anterior rectus sheath.  Subcutaneous tissue was mobilized proximally and distally so that it would  accommodate a longitudinal incision in the anterior rectus sheath just lateral to the midline.  The medial edge of the rectus was then mobilized using cutting cautery and blunt dissection off the midline structures and the rectus retracted laterally.  Because we were at L5-S1, it was not necessary to dissect above the arcuate line.  The blunt dissection was carried through the preperitoneal fat into the retroperitoneum and the common iliac vessels identified.  The left ureter was also identified.  The peritoneal sac was mobilized to the right side using blunt  dissection at all times.  The L5-S1 disk was visualized easily.  Soft tissue dissection was carried out mobilizing right and left common iliac vessels and Brau reverse angle retractors placed and attached to a Thompson table mounted retractor.  Another Brau retractor blade was placed for superior retraction with its tip biting against the body of L5.  A malleable retractor was placed in front of S1.  A cross-table lateral fluoroscopic view confirmed our position at the right level.  I then performed a rectangular annulotomy using cautery.  The anterior annulus and some of the nucleus removed with a Leksell rongeur.  This revealed the front of the previously placed interbody cage which was positioned from right posterior to left anterior.  Under fluoroscopic guidance, osteotomes were used to develop a plane superior and inferior to the cage.  The cage was then grasped with a long Leksell rongeur and delivered out of the disk space without much difficulty.  The disk space was somewhat asymmetric, and so far as the bone subjacent and superjacent to the cage was eroded.  The disk was enucleated and as much as disk as possible removed, using lateral fluoroscopic views to judge our depth.  Once I had prepared the endplate satisfactorily and moved enough disks, a broach from the [facility] implant set was used to further prepare the endplates and sized up to an appropriate size.  The medium footprint implant was chosen.  It was packed with the bone from the iliac crest and there was sufficient bone to not require any augmentation.  Then, under fluoroscopic guidance, the implant was impacted into place.  It was then fixed with 3 screws, again placed under fluoroscopic guidance.  At this point, the retractors were removed sequentially.  I inspected the left ureter which was intact as well as the left common iliac vessels which appeared not to be injured.  At no time during the case did the pulse  oximeter alarmed.  We then, having removed the retractors closed the wound using continuous #1 Vicryl suture with interrupted sutures in the anterior rectus sheath.  Subcutaneous layer was closed using running 2-0 undyed Vicryl.  The skin was closed using a subcuticular 3-0 undyed Vicryl in running fashion.  The skin edges were closed using Dermabond.  The bone graft harvest wound was closed in similar fashion for the subcutaneous and skin layers.  Subsequent to finishing the case, flat plate of the abdomen was taken, per [facility] health routine, to assess for any retained sponges or instruments and none were present as anticipated.  The sponge and needle counts were correct. There were no intraoperative complications.  Blood loss estimated at 100 mL.  The patient was stable throughout the procedure.  At the time of dictation, the patient has not yet returned to the recovery room, so no examination is reported here.     Nelda Severe, MD     MT/MEDQ  D:  06/24/2011  T:  06/25/2011  Job:  [id number]  Electronically Signed by Nelda Severe MD on 08/06/2011 02:02:59 PM

## 2011-08-06 NOTE — H&P (Signed)
  NAMEJAMIESON, LISA                ACCOUNT NO.:  [id number]  MEDICAL RECORD NO.:  [id number]  LOCATION:  XRAY                         FACILITY:  [facility]  PHYSICIAN:  Nelda Severe, MD      DATE OF BIRTH:  Jun 03, 1976  DATE OF ADMISSION:  06/20/2011 DATE OF DISCHARGE:                             HISTORY & PHYSICAL   CHIEF COMPLAINT:  Status post lumbar fusion with pseudoarthrosis, L5-S1; chronic lumbar pain with right L5 distribution leg pain.  Lumbar pain is significantly worse than leg pain.  DRUG ALLERGIES:  MORPHINE and FLAGYL.  CURRENT MEDICATIONS: 1. Opana ER 30 mg q.12. 2. Endocet 7.5/oxycodone one q.4-6 p.r.n. for pain. 3. Adderall 30 mg every other day.  PAST MEDICAL HISTORY: 1. Two lumbar surgeries. 2. Three C-sections. 3. Two breast biopsies which were benign.  FAMILY HISTORY:  Father with COPD and arthritis.  Mother with osteoporosis.  REVIEW OF SYSTEMS:  She reports no chest pain, no chronic cough, no shortness of breath.  No nausea, vomiting, or diarrhea.  No hemoptysis. No melena.  PHYSICAL EXAMINATION:  GENERAL:  She is normocephalic in appearance. HEENT:  Pupils are round, equal, and reactive to light. NECK:  Supple.  No adenopathy noted. CHEST:  Clear to auscultation.  No wheezes are noted. HEART:  Regular rate and rhythm.  No murmurs noted. ABDOMEN:  Soft, nontender to palpation.  Positive bowel sounds auscultated. EXTREMITIES:  Distally, she is neurovascularly motor intact.  She has lumbar pain to palpation at L5-S1. SKIN:  On the abdomen is clean, dry, and intact.  No rashes noted.  DIAGNOSIS:  Pseudoarthrosis, L5-S1.  OPERATIVE PLAN:  Removal of TLIF anterior followed by anterior interbody fusion with iliac crest bone graft harvest.     Lianne Cure, P.A.   ______________________________ Nelda Severe, MD    MC/MEDQ  D:  06/20/2011  T:  06/20/2011  Job:  [id number]  Electronically Signed by Lianne Cure P.A. on 07/24/2011 12:01:03  PM Electronically Signed by Nelda Severe MD on 08/06/2011 02:03:06 PM

## 2011-08-06 NOTE — Discharge Summary (Signed)
  NAMECARINNA, [name]                ACCOUNT NO.:  [id number]  MEDICAL RECORD NO.:  [id number]  LOCATION:  5002                         FACILITY:  [facility]  PHYSICIAN:  Nelda Severe, MD      DATE OF BIRTH:  [birth year]  DATE OF ADMISSION:  06/24/2011 DATE OF DISCHARGE:  06/26/2011                              DISCHARGE SUMMARY   FINAL DIAGNOSIS:  Pseudoarthrosis L5-S1 status post prior posterior thoracolumbar fusion.  This lady was admitted for management of L5-S1 pseudoarthrosis following a prior fusion.  She was taken to the operating room the day of admission where an anterior interbody fusion including removal of a previously posteriorly placed interbody cage was carried out.  The fusion was performed using autogenous iliac crest graft and an anterior Titan cage.  There were no intraoperative complications.  There were no postoperative complications.  The blood loss was minimal.  She is being prophylactically placed on Lovenox 40 mg daily subcutaneously for 2 weeks.  She should return to the office for follow-up in 2 weeks.  At the time of discharge, she is ambulatory.  Wounds are without drainage. Pain control was satisfactory.  DISCHARGE MEDICATIONS:  Percocet 10, one to two q. 4 h. maximum 8 per day, 100 tablets; Robaxin 500 mg 100 tablets, one to two q. 6 h. p.r.n. spasm; Lovenox 40 mg subcutaneously daily at 6 p.m. for 2 weeks.     Nelda Severe, MD     MT/MEDQ  D:  06/26/2011  T:  06/26/2011  Job:  [id number]  Electronically Signed by Nelda Severe MD on 08/06/2011 02:03:01 PM

## 2011-08-07 LAB — URINALYSIS, ROUTINE W REFLEX MICROSCOPIC
Bilirubin Urine: NEGATIVE
Glucose, UA: NEGATIVE
Ketones, ur: NEGATIVE
Nitrite: NEGATIVE
Protein, ur: 30 — AB
Specific Gravity, Urine: 1.021
Urobilinogen, UA: 1
pH: 7

## 2011-08-07 LAB — I-STAT 8, (EC8 V) (CONVERTED LAB)
BUN: 9
Chloride: 108
HCT: 41
Hemoglobin: 13.9
Operator id: 151321
Potassium: 4
Sodium: 140
pCO2, Ven: 52.1 — ABNORMAL HIGH

## 2011-08-07 LAB — DIFFERENTIAL
Basophils Relative: 0
Lymphs Abs: 2.1
Monocytes Absolute: 0.8
Monocytes Relative: 7
Neutro Abs: 8.7 — ABNORMAL HIGH

## 2011-08-07 LAB — URINE MICROSCOPIC-ADD ON

## 2011-08-07 LAB — PREGNANCY, URINE: Preg Test, Ur: NEGATIVE

## 2011-08-07 LAB — CBC
HCT: 38.2
Hemoglobin: 12.8
MCHC: 33.4
MCV: 94.7
Platelets: 236
RBC: 4.04
RDW: 13.1
WBC: 11.7 — ABNORMAL HIGH

## 2011-08-07 LAB — URINE CULTURE

## 2011-08-28 ENCOUNTER — Other Ambulatory Visit: Payer: Self-pay | Admitting: Medical

## 2011-08-29 LAB — BASIC METABOLIC PANEL
BUN: 2 — ABNORMAL LOW
CO2: 27
Calcium: 9.1
GFR calc non Af Amer: >60
Glucose, Bld: 105 — ABNORMAL HIGH

## 2011-08-29 LAB — URINALYSIS, ROUTINE W REFLEX MICROSCOPIC
Bilirubin Urine: NEGATIVE
Hgb urine dipstick: NEGATIVE
Ketones, ur: NEGATIVE
Nitrite: NEGATIVE
Urobilinogen, UA: 0.2

## 2011-08-29 LAB — PREGNANCY, URINE: Preg Test, Ur: NEGATIVE

## 2011-09-05 ENCOUNTER — Encounter: Payer: Medicare Other | Attending: Physical Medicine & Rehabilitation | Admitting: Physical Medicine & Rehabilitation

## 2011-09-05 DIAGNOSIS — IMO0001 Reserved for inherently not codable concepts without codable children: Secondary | ICD-10-CM | POA: Insufficient documentation

## 2011-09-05 DIAGNOSIS — M961 Postlaminectomy syndrome, not elsewhere classified: Secondary | ICD-10-CM

## 2011-09-05 DIAGNOSIS — F341 Dysthymic disorder: Secondary | ICD-10-CM

## 2011-09-05 DIAGNOSIS — M4120 Other idiopathic scoliosis, site unspecified: Secondary | ICD-10-CM | POA: Insufficient documentation

## 2011-09-05 DIAGNOSIS — G8928 Other chronic postprocedural pain: Secondary | ICD-10-CM | POA: Insufficient documentation

## 2011-09-05 DIAGNOSIS — G8918 Other acute postprocedural pain: Secondary | ICD-10-CM | POA: Insufficient documentation

## 2011-09-05 DIAGNOSIS — G8929 Other chronic pain: Secondary | ICD-10-CM | POA: Insufficient documentation

## 2011-09-05 DIAGNOSIS — M79609 Pain in unspecified limb: Secondary | ICD-10-CM | POA: Insufficient documentation

## 2011-09-05 DIAGNOSIS — M47812 Spondylosis without myelopathy or radiculopathy, cervical region: Secondary | ICD-10-CM | POA: Insufficient documentation

## 2011-09-05 DIAGNOSIS — Z981 Arthrodesis status: Secondary | ICD-10-CM | POA: Insufficient documentation

## 2011-09-05 DIAGNOSIS — M549 Dorsalgia, unspecified: Secondary | ICD-10-CM | POA: Insufficient documentation

## 2011-09-05 DIAGNOSIS — M47817 Spondylosis without myelopathy or radiculopathy, lumbosacral region: Secondary | ICD-10-CM

## 2011-09-05 NOTE — Assessment & Plan Note (Signed)
[name] is back regarding her chronic back pain.  She had her surgery by Dr. Alveda Reasons about 3 months ago.  She is still having significant pain in the back and both legs and seems to be having more pain up in the shoulders and lower neck currently.  Her pain is about 7-8/10.  She complains of spasms in both legs as well as occasionally in the back. The pain sometimes is sharp, burning and stabbing.  She has tingling and aching pain as well.  Pain seems to bother her more when she is up and active performing chores around the house, etc.  REVIEW OF SYSTEMS:  Notable for the above.  Full 12-point review is in the written health and history section of the chart.  She does report some ongoing problems with depression which seemed to be getting worse now.  She has had depression on and off for some time even before she came here.  SOCIAL HISTORY:  The patient is divorced, living with a 3 children.  She is still smoking half-pack of cigarettes per day.  PHYSICAL EXAMINATION:  VITAL SIGNS:  Blood pressure is 125/81, pulse is 107, respiratory rate 18 and she is satting 100% on room air. GENERAL:  The patient is pleasant, alert and oriented x3.  Affect is generally bright and appropriate.  EXTREMITIES:  She has pain still with palpation over the lumbar spine.  In standing her pelvis is essentially symmetrical, but she has significant level of scoliosis and really leftward shift of the spine.  She has uneven pressures on the rhomboids as well as the traps and the scapular stabilizing muscles.  She has diffused periscapular and cervical tenderness on exam with very few of any trigger points palpated.  She is able to bend on a very limited fashion to about 40 degrees.  She extends to about 15 degrees both of which cause pain.  Strength is grossly 5/5 with pain inhibition noted still proximally especially in the lower extremities. NEUROLOGIC:  Mood was generally pleasant and  cooperative.  ASSESSMENT: 1. Status post anterior approach L5-S1 fusion. 2. Scoliosis with constant radicular pain. 3. Myofascial pain with cervical spondylosis. 4. Acute on chronic postoperative pain.  PLAN: 1. We will continue with the Opana ER 1 q.12 h. #60 and oxycodone 7.5     one q.6 h. p.r.n. #120.  Consider rotating her long-acting opiates     perhaps in the future. 2. I will send her to physical therapy to work on range of motion,     stretching and posture for the shoulder girdle and cervical     regions.  I would like her to learn some ways to stretch and to     find some modalities that are helpful for her.  In my opinion, she     has tried to put off any type of surgical intervention there as     long as possible. 3. We will add Effexor for her mood 37.5 mg b.i.d. for 1 week, then 75     mg b.i.d. thereafter. 4. We will introduce Zanaflex for spasm 2 mg 1 at bedtime and perhaps     than 1 q.12 h. p.r.n. as tolerated.  Prescription reads q.6 h.     p.r.n. which she can titrate up to if she is able to tolerate the     increased dose. 5. She will see my nurse practitioner back in about a month.     Ranelle Oyster, M.D. Electronically Signed  ZTS/MedQ D:  09/05/2011 12:50:54  T:  09/05/2011 22:33:56  Job #:  [id number]

## 2011-09-08 ENCOUNTER — Ambulatory Visit: Payer: Medicare Other | Admitting: Physical Medicine & Rehabilitation

## 2011-10-06 ENCOUNTER — Encounter: Payer: Medicare Other | Attending: Neurosurgery | Admitting: Neurosurgery

## 2011-10-06 DIAGNOSIS — M47812 Spondylosis without myelopathy or radiculopathy, cervical region: Secondary | ICD-10-CM | POA: Insufficient documentation

## 2011-10-06 DIAGNOSIS — Z981 Arthrodesis status: Secondary | ICD-10-CM | POA: Insufficient documentation

## 2011-10-06 DIAGNOSIS — M545 Low back pain, unspecified: Secondary | ICD-10-CM | POA: Insufficient documentation

## 2011-10-06 DIAGNOSIS — IMO0001 Reserved for inherently not codable concepts without codable children: Secondary | ICD-10-CM | POA: Insufficient documentation

## 2011-10-06 DIAGNOSIS — G8928 Other chronic postprocedural pain: Secondary | ICD-10-CM

## 2011-10-06 DIAGNOSIS — M961 Postlaminectomy syndrome, not elsewhere classified: Secondary | ICD-10-CM

## 2011-10-06 DIAGNOSIS — M4120 Other idiopathic scoliosis, site unspecified: Secondary | ICD-10-CM | POA: Insufficient documentation

## 2011-10-06 NOTE — Assessment & Plan Note (Signed)
This is a patient of Dr. Riley Kill seen for chronic low back pain status post surgery fusions with Dr. Alveda Reasons.  [name] rates her average pain is 6-7. It is a sharp burning, dull, stabbing, tingling aching pain.  General activity level is 5-7.  Pain is worse in the morning and evening. Walking, bending, standing, activity aggravate.  Rest, medication, TENS, injection helps.  [name] is in therapy right now.  Mobility, [name] walks with and without assistance.  [name] can walk about 5 minutes at a time.  [name] does not climb steps.  [name] does drive.  [name] needs some help with transfers.  REVIEW OF SYSTEMS:  Notable for difficulties described above as well as some night sweats, constipation, poor appetite, urinary retention, limb swelling, weakness, paresthesias, spasms, dizziness, and anxiety.  No suicidal thoughts or aberrant behaviors.  Last pill count and UDS consistent.  Her Oswestry score is 64.  PAST MEDICAL HISTORY:  Unchanged.  SOCIAL HISTORY:  Unchanged.  FAMILY HISTORY:  Unchanged.  PHYSICAL EXAMINATION:  Blood pressure is 115/75, pulse 114, respirations 18, O2 sats 100 on room air.  Motor strength and sensation appear to be intact.  [name] does have some lateral pelvic tilt due to her scoliosis. Constitutionally, [name] is within normal limits.  [name] is alert and oriented x3.  [name] has fairly normal gait.  ASSESSMENT: 1. Status post anterior approach L5-S1 fusion. 2. Scoliosis. 3. Myofascial pain. 4. Cervical spondylosis.  PLAN: 1. Refill Opana ER 30 mg 1 p.o. q.12 hours 60 with no refill. 2. Oxycodone 5/7.5/325 one p.o. q.6 hours p.r.n., 120 with no refill.     Questions were encouraged and answered.  I will see her in a month.     Rashi Granier L. Blima Dessert Electronically Signed    RLW/MedQ D:  10/06/2011 13:35:21  T:  10/06/2011 20:35:50  Job #:  [id number]

## 2011-10-22 ENCOUNTER — Emergency Department (HOSPITAL_COMMUNITY): Payer: Medicare Other

## 2011-10-22 ENCOUNTER — Encounter: Payer: Self-pay | Admitting: *Deleted

## 2011-10-22 ENCOUNTER — Emergency Department (HOSPITAL_COMMUNITY)
Admission: EM | Admit: 2011-10-22 | Discharge: 2011-10-22 | Disposition: A | Discharge status: Home / Self Care | Payer: Medicare Other | Attending: Emergency Medicine | Admitting: Emergency Medicine

## 2011-10-22 DIAGNOSIS — F17200 Nicotine dependence, unspecified, uncomplicated: Secondary | ICD-10-CM | POA: Insufficient documentation

## 2011-10-22 DIAGNOSIS — M4120 Other idiopathic scoliosis, site unspecified: Secondary | ICD-10-CM | POA: Insufficient documentation

## 2011-10-22 DIAGNOSIS — M129 Arthropathy, unspecified: Secondary | ICD-10-CM | POA: Insufficient documentation

## 2011-10-22 DIAGNOSIS — M545 Low back pain, unspecified: Secondary | ICD-10-CM

## 2011-10-22 DIAGNOSIS — N289 Disorder of kidney and ureter, unspecified: Secondary | ICD-10-CM | POA: Insufficient documentation

## 2011-10-22 DIAGNOSIS — Z9889 Other specified postprocedural states: Secondary | ICD-10-CM | POA: Insufficient documentation

## 2011-10-22 DIAGNOSIS — R296 Repeated falls: Secondary | ICD-10-CM | POA: Insufficient documentation

## 2011-10-22 HISTORY — DX: Unspecified osteoarthritis, unspecified site: M19.90

## 2011-10-22 HISTORY — DX: Other idiopathic scoliosis, site unspecified: M41.20

## 2011-10-22 HISTORY — DX: Disorder of kidney and ureter, unspecified: N28.9

## 2011-10-22 MED ORDER — HYDROMORPHONE HCL PF 1 MG/ML IJ SOLN
1.0000 mg | Freq: Once | INTRAMUSCULAR | Status: AC
  Administered 2011-10-22: 1 mg via INTRAMUSCULAR
  Filled 2011-10-22: qty 1

## 2011-10-22 NOTE — ED Provider Notes (Signed)
History     CSN: [id number] Arrival date & time: 10/22/2011 12:51 AM   First MD Initiated Contact with Patient 10/22/11 0206      Chief Complaint  Patient presents with  . Back Pain    (Consider location/radiation/quality/duration/timing/severity/associated sxs/prior treatment) HPI Comments: Has history of multiple back surgeries.  Today was getting something out of the cabinet, and fell backward injuring back.  No bowel or bladder complaints.  No lower extremity weakness or numbness.  Patient is a 35 y.o. female presenting with back pain. The history is provided by the patient.  Back Pain  This is a recurrent problem. The current episode started 3 to 5 hours ago. The problem occurs constantly. The problem has been rapidly worsening. The pain is associated with falling. The quality of the pain is described as stabbing. The pain does not radiate. The pain is at a severity of 9/10. The pain is severe. The symptoms are aggravated by bending, twisting and certain positions. The pain is the same all the time. She has tried nothing for the symptoms.    Past Medical History  Diagnosis Date  . Scoliosis (and kyphoscoliosis), idiopathic   . Arthritis   . Renal disorder     Past Surgical History  Procedure Date  . Back surgery   . Breast surgery     No family history on file.  History  Substance Use Topics  . Smoking status: Current Everyday Smoker  . Smokeless tobacco: Not on file  . Alcohol Use: No    OB History    Grav Para Term Preterm Abortions TAB SAB Ect Mult Living                  Review of Systems  Musculoskeletal: Positive for back pain.  All other systems reviewed and are negative.    Allergies  Flagyl and Morphine and related  Home Medications   Current Outpatient Rx  Name Route Sig Dispense Refill  . XANAX PO Oral Take by mouth daily as needed.      . AMPHETAMINE-DEXTROAMPHETAMINE 30 MG PO TABS Oral Take 30 mg by mouth daily.      Marland Kitchen DICLOFENAC  SODIUM 1 % TD GEL Topical Apply topically.      . DULOXETINE HCL 20 MG PO CPEP Oral Take 20 mg by mouth daily.      . FUROSEMIDE 40 MG PO TABS Oral Take 40 mg by mouth daily.      . OXYCODONE-ACETAMINOPHEN 7.5-325 MG PO TABS Oral Take 1 tablet by mouth every 4 (four) hours as needed.        BP 131/82  Pulse 111  Temp(Src) 98.2 F (36.8 C) (Oral)  Resp 18  Ht 5\' 2"  (1.575 m)  Wt 125 lb (56.7 kg)  BMI 22.86 kg/m2  SpO2 99%  LMP 10/08/2011  Physical Exam  Constitutional: She is oriented to person, place, and time. She appears well-developed and well-nourished.  HENT:  Head: Normocephalic and atraumatic.  Neck: Normal range of motion. Neck supple.  Abdominal: Soft. Bowel sounds are normal. There is no tenderness.  Musculoskeletal:       ttp in the paraspinal soft tissues of the lumbar spine.  Neurological: She is alert and oriented to person, place, and time.       DTR's are 1+ and equal ble.  Strength 5/5 ble.  Ambulatory without difficulty.  Skin: Skin is warm and dry.    ED Course  Procedures (including critical care time)  Labs  Reviewed - No data to display No results found.   No diagnosis found.    MDM  Xrays show fractured rod similar to priors.  No signs of neurological compromise/cauda equina.  Will discharge and continue pain meds as before.        Geoffery Lyons, MD 10/22/11 [phone]

## 2011-10-22 NOTE — ED Notes (Signed)
MD at bedside. 

## 2011-10-22 NOTE — ED Notes (Signed)
Pt reports back surgery x 3, reports she fell and twisted her back earlier today and now pain is worse

## 2011-10-31 ENCOUNTER — Telehealth: Payer: Self-pay | Admitting: Internal Medicine

## 2011-10-31 NOTE — Telephone Encounter (Signed)
Not a pt here.

## 2011-11-04 ENCOUNTER — Ambulatory Visit: Payer: Medicare Other | Admitting: Neurosurgery

## 2011-11-05 ENCOUNTER — Encounter: Payer: Medicare Other | Attending: Neurosurgery | Admitting: Neurosurgery

## 2011-11-05 DIAGNOSIS — M545 Low back pain, unspecified: Secondary | ICD-10-CM | POA: Insufficient documentation

## 2011-11-05 DIAGNOSIS — Y831 Surgical operation with implant of artificial internal device as the cause of abnormal reaction of the patient, or of later complication, without mention of misadventure at the time of the procedure: Secondary | ICD-10-CM | POA: Insufficient documentation

## 2011-11-05 DIAGNOSIS — M47812 Spondylosis without myelopathy or radiculopathy, cervical region: Secondary | ICD-10-CM | POA: Insufficient documentation

## 2011-11-05 DIAGNOSIS — G8928 Other chronic postprocedural pain: Secondary | ICD-10-CM

## 2011-11-05 DIAGNOSIS — T84498A Other mechanical complication of other internal orthopedic devices, implants and grafts, initial encounter: Secondary | ICD-10-CM | POA: Insufficient documentation

## 2011-11-05 DIAGNOSIS — M4120 Other idiopathic scoliosis, site unspecified: Secondary | ICD-10-CM | POA: Insufficient documentation

## 2011-11-05 DIAGNOSIS — Z981 Arthrodesis status: Secondary | ICD-10-CM | POA: Insufficient documentation

## 2011-11-05 DIAGNOSIS — G8929 Other chronic pain: Secondary | ICD-10-CM | POA: Insufficient documentation

## 2011-11-06 NOTE — Assessment & Plan Note (Signed)
This patient of Dr. Riley Kill seen for chronic low back pain, status post fusion with Dr. Alveda Reasons.  The patient states she has a broken rod "in her back."  She is going to have to have surgery again.  There was questions about filling her pain medicines.  She states that the surgeon's office took her off her Opana and her own fentanyl patches which she is out of. I encouraged her to go back to get those refilled and she also states they gave her oxycodone which she is out of.  She did visit the [facility] Emergency Room on October 22, 2011, and supposedly was sent to [facility] for a CT scan by Dr. Toni Arthurs office.  She rates her average pain at a 6 to an 8.  It is a sharp, stabbing, tingling and aching pain. General activity level is 5-7.  The pain is worse in the morning, evening and night.  Sleep patterns are poor.  Walking, bending, standing and activity aggravate.  Rest, medication and injections help.  She walks with and without assistance.  She can walk about 5 minutes at a time.  She does not climb steps.  She drives.  She needs some help with transfers.  She is on disability.  Needs help with ADLs and household duties.  REVIEW OF SYSTEMS:  Notable for difficulties described above as well as some fever, night sweats, nausea, constipation, urinary retention, poor appetite, limb swelling, paresthesias, trouble walking, spasms, anxiety, no suicidal thoughts or aberrant behaviors.  Last UDS and pill counts consistent, although it is hard to keep up with the pill counts with the medicines to be unchanged.  PAST MEDICAL HISTORY, SOCIAL HISTORY AND FAMILY HISTORY:  Unchanged.  PHYSICAL EXAMINATION:  VITAL SIGNS:  Her blood pressure is 113/71, pulse 112, respirations 18 and O2 sats 98 on room air.  EXTREMITIES:  Her sensation is intact in the upper and lower extremities.  Motor strength is diminished in the lower extremities due to pain.  ASSESSMENT: 1. Anterior approach at L5-S1 fusion with  fractured titanium rod     pending further surgery by Dr. Alveda Reasons. 2. Scoliosis. 3. Myofascial pain. 4. Cervical spondylosis.  PLAN: 1. I refilled oxycodone 7.5/325 one p.o. q.6 h. p.r.n., 120 with no     refill. 2. Referred and deferred back to Dr. Dr. Toni Arthurs office for a further     pain management postoperatively 3. She will follow up with Dr. Riley Kill in 1 month.  Her ED visit record     and radiograph records are on the chart from her October 22, 2011,     ED visit at [facility].     Nesbit Michon L. Blima Dessert Electronically Signed    RLW/MedQ D:  11/05/2011 13:00:29  T:  11/06/2011 05:56:00  Job #:  [id number]

## 2011-11-18 ENCOUNTER — Encounter (HOSPITAL_COMMUNITY): Payer: Self-pay

## 2011-11-18 ENCOUNTER — Emergency Department (HOSPITAL_COMMUNITY): Payer: Medicare Other

## 2011-11-18 ENCOUNTER — Emergency Department (HOSPITAL_COMMUNITY)
Admission: EM | Admit: 2011-11-18 | Discharge: 2011-11-18 | Disposition: A | Discharge status: Home / Self Care | Payer: Medicare Other | Attending: Emergency Medicine | Admitting: Emergency Medicine

## 2011-11-18 DIAGNOSIS — N390 Urinary tract infection, site not specified: Secondary | ICD-10-CM

## 2011-11-18 DIAGNOSIS — M4120 Other idiopathic scoliosis, site unspecified: Secondary | ICD-10-CM | POA: Insufficient documentation

## 2011-11-18 DIAGNOSIS — N200 Calculus of kidney: Secondary | ICD-10-CM | POA: Insufficient documentation

## 2011-11-18 DIAGNOSIS — F17200 Nicotine dependence, unspecified, uncomplicated: Secondary | ICD-10-CM | POA: Insufficient documentation

## 2011-11-18 LAB — BASIC METABOLIC PANEL
CO2: 28 mEq/L (ref 19–32)
Chloride: 101 mEq/L (ref 96–112)
Glucose, Bld: 112 mg/dL — ABNORMAL HIGH (ref 70–99)
Potassium: 3.6 mEq/L (ref 3.5–5.1)
Sodium: 136 mEq/L (ref 135–145)

## 2011-11-18 LAB — CBC
HCT: 38.5 % (ref 36.0–46.0)
Hemoglobin: 12.8 g/dL (ref 12.0–15.0)
MCH: 31.4 pg (ref 26.0–34.0)
MCV: 94.6 fL (ref 78.0–100.0)
RBC: 4.07 MIL/uL (ref 3.87–5.11)
WBC: 12.4 10*3/uL — ABNORMAL HIGH (ref 4.0–10.5)

## 2011-11-18 LAB — DIFFERENTIAL
Eosinophils Absolute: 0.2 10*3/uL (ref 0.0–0.7)
Eosinophils Relative: 2 % (ref 0–5)
Lymphocytes Relative: 28 % (ref 12–46)
Lymphs Abs: 3.5 10*3/uL (ref 0.7–4.0)
Monocytes Relative: 8 % (ref 3–12)

## 2011-11-18 LAB — URINALYSIS, ROUTINE W REFLEX MICROSCOPIC
Glucose, UA: NEGATIVE mg/dL
Specific Gravity, Urine: 1.02 (ref 1.005–1.030)
Urobilinogen, UA: 1 mg/dL (ref 0.0–1.0)
pH: 7 (ref 5.0–8.0)

## 2011-11-18 LAB — URINE MICROSCOPIC-ADD ON

## 2011-11-18 MED ORDER — HYDROMORPHONE HCL PF 1 MG/ML IJ SOLN
1.0000 mg | Freq: Once | INTRAMUSCULAR | Status: AC
  Administered 2011-11-18: 1 mg via INTRAVENOUS
  Filled 2011-11-18: qty 1

## 2011-11-18 MED ORDER — CEPHALEXIN 500 MG PO CAPS
1000.0000 mg | ORAL_CAPSULE | Freq: Once | ORAL | Status: AC
  Administered 2011-11-18: 1000 mg via ORAL
  Filled 2011-11-18: qty 2

## 2011-11-18 MED ORDER — HYDROMORPHONE HCL 2 MG PO TABS: 2.0000 mg | ORAL_TABLET | ORAL | Status: DC | PRN

## 2011-11-18 MED ORDER — KETOROLAC TROMETHAMINE 30 MG/ML IJ SOLN
30.0000 mg | Freq: Once | INTRAMUSCULAR | Status: AC
  Administered 2011-11-18: 30 mg via INTRAVENOUS
  Filled 2011-11-18: qty 1

## 2011-11-18 MED ORDER — CEPHALEXIN 500 MG PO CAPS: 1000.0000 mg | ORAL_CAPSULE | Freq: Two times a day (BID) | ORAL | Status: AC

## 2011-11-18 MED ORDER — ONDANSETRON HCL 4 MG PO TABS: 4.0000 mg | ORAL_TABLET | Freq: Three times a day (TID) | ORAL | Status: AC | PRN

## 2011-11-18 MED ORDER — ONDANSETRON HCL 4 MG/2ML IJ SOLN
4.0000 mg | Freq: Once | INTRAMUSCULAR | Status: AC
  Administered 2011-11-18: 4 mg via INTRAVENOUS
  Filled 2011-11-18: qty 2

## 2011-11-18 NOTE — ED Notes (Signed)
Patient transported to X-ray 

## 2011-11-18 NOTE — ED Notes (Signed)
Strainer given to patient along with RX x 3 and discharge instructions

## 2011-11-18 NOTE — ED Notes (Signed)
Pt presents with right sided flank pain and hematuria that started today. Pt also reports nausea.

## 2011-11-18 NOTE — ED Notes (Signed)
Up to [facility] - voided with urine strained - large amount of bright red blood visible in toilet

## 2011-11-18 NOTE — ED Notes (Signed)
Returned from [employer]- pain increasing - medicated as ordered

## 2011-11-18 NOTE — ED Provider Notes (Signed)
History     CSN: [id number]  Arrival date & time 11/18/11  1914   First MD Initiated Contact with Patient 11/18/11 1929      Chief Complaint  Patient presents with  . Hematuria  . Flank Pain    (Consider location/radiation/quality/duration/timing/severity/associated sxs/prior treatment) Patient is a 36 y.o. female presenting with hematuria and flank pain. The history is provided by the patient.  Hematuria This is a new problem. The current episode started today. The problem is unchanged. She describes the hematuria as gross hematuria. The hematuria occurs throughout her entire urinary stream. She reports no clotting in her urine stream. Her pain is at a severity of 7/10 (She reports constant burning pain at her urethra  "like previous episodes of passing kidney stones".). The pain is moderate. She describes her urine color as dark red. Irritative symptoms include frequency. Irritative symptoms do not include urgency. Associated symptoms include dysuria, flank pain and nausea. Pertinent negatives include no abdominal pain, chills, fever or vomiting. Her sexual activity is non-contributory to the current illness. Her past medical history is significant for kidney stones. (Medullary sponge kidney)  Flank Pain Associated symptoms include nausea. Pertinent negatives include no abdominal pain, arthralgias, chest pain, chills, congestion, fever, headaches, joint swelling, neck pain, numbness, rash, sore throat, vomiting or weakness.    Past Medical History  Diagnosis Date  . Scoliosis (and kyphoscoliosis), idiopathic   . Arthritis   . Renal disorder     Past Surgical History  Procedure Date  . Back surgery   . Breast surgery     No family history on file.  History  Substance Use Topics  . Smoking status: Current Everyday Smoker  . Smokeless tobacco: Not on file  . Alcohol Use: No    OB History    Grav Para Term Preterm Abortions TAB SAB Ect Mult Living                   Review of Systems  Constitutional: Negative for fever and chills.  HENT: Negative for congestion, sore throat and neck pain.   Eyes: Negative.   Respiratory: Negative for chest tightness and shortness of breath.   Cardiovascular: Negative for chest pain.  Gastrointestinal: Positive for nausea. Negative for vomiting and abdominal pain.  Genitourinary: Positive for dysuria, frequency, hematuria and flank pain. Negative for urgency and vaginal discharge.  Musculoskeletal: Negative for joint swelling and arthralgias.  Skin: Negative.  Negative for rash and wound.  Neurological: Negative for dizziness, weakness, light-headedness, numbness and headaches.  Hematological: Negative.   Psychiatric/Behavioral: Negative.     Allergies  Flagyl and Morphine and related  Home Medications   Current Outpatient Rx  Name Route Sig Dispense Refill  . ALPRAZOLAM 0.5 MG PO TABS Oral Take 0.5 mg by mouth daily as needed. For anxiety     . AMPHETAMINE-DEXTROAMPHETAMINE 30 MG PO TABS Oral Take 30 mg by mouth daily.      . BC HEADACHE POWDER PO Oral Take 0.5 packets by mouth as needed. For pain     . DICLOFENAC SODIUM 1 % TD GEL Topical Apply 1 application topically 4 (four) times daily.     . FENTANYL 25 MCG/HR TD PT72 Transdermal Place 0.5-1 patches onto the skin every 3 (three) days.      . FUROSEMIDE 40 MG PO TABS Oral Take 40 mg by mouth daily as needed. For FLUID RETENTION    . OXYCODONE-ACETAMINOPHEN 7.5-325 MG PO TABS Oral Take 1 tablet by mouth every  4 (four) hours as needed. For pain      BP 113/58  Pulse 92  Temp(Src) 97.6 F (36.4 C) (Oral)  Resp 20  Ht 5' 1.5" (1.562 m)  Wt 121 lb (54.885 kg)  BMI 22.49 kg/m2  SpO2 99%  LMP 11/10/2011  Physical Exam  Nursing note and vitals reviewed. Constitutional: She is oriented to person, place, and time. She appears well-developed and well-nourished.  HENT:  Head: Normocephalic and atraumatic.  Eyes: Conjunctivae are normal.  Neck:  Normal range of motion.  Cardiovascular: Normal rate, regular rhythm, normal heart sounds and intact distal pulses.   Pulmonary/Chest: Effort normal and breath sounds normal. She has no wheezes.  Abdominal: Soft. Bowel sounds are normal. She exhibits no mass. There is no rebound and no guarding.       TTP right flank.  Musculoskeletal: Normal range of motion.  Neurological: She is alert and oriented to person, place, and time.  Skin: Skin is warm and dry.  Psychiatric: She has a normal mood and affect.    ED Course  Procedures (including critical care time)  Labs Reviewed  URINALYSIS, ROUTINE W REFLEX MICROSCOPIC - Abnormal; Notable for the following:    Color, Urine RED (*) BIOCHEMICALS MAY BE AFFECTED BY COLOR   APPearance CLOUDY (*)    Hgb urine dipstick LARGE (*)    Bilirubin Urine SMALL (*)    Ketones, ur 15 (*)    Protein, ur >300 (*)    Nitrite POSITIVE (*)    Leukocytes, UA SMALL (*)    All other components within normal limits  BASIC METABOLIC PANEL - Abnormal; Notable for the following:    Glucose, Bld 112 (*)    All other components within normal limits  CBC - Abnormal; Notable for the following:    WBC 12.4 (*)    All other components within normal limits  URINE MICROSCOPIC-ADD ON - Abnormal; Notable for the following:    Bacteria, UA FEW (*)    All other components within normal limits  PREGNANCY, URINE  DIFFERENTIAL   No results found.   No diagnosis found.  Urine culture ordered.  Pt comfortable after dilaudid 1 mg IV and zofran 4 mg IV given.  Review of CT scan from 06/02/11 revealed right renal stone (study to assess her lumbar surgical hardware)  MDM  Urine strainer,  Keflex 1 gram bid x 7 days.  zofran prn.  Call Dr. Rito Ehrlich in am for recheck this week.        Candis Musa, PA 11/18/11 2102

## 2011-11-19 NOTE — ED Provider Notes (Signed)
Medical screening examination/treatment/procedure(s) were performed by non-physician practitioner and as supervising physician I was immediately available for consultation/collaboration.  Nicoletta Dress. Colon Branch, MD 11/19/11 1351

## 2011-11-20 LAB — URINE CULTURE
Colony Count: NO GROWTH
Culture: NO GROWTH

## 2011-11-23 ENCOUNTER — Emergency Department (HOSPITAL_COMMUNITY): Payer: Medicare Other

## 2011-11-23 ENCOUNTER — Emergency Department (HOSPITAL_COMMUNITY)
Admission: EM | Admit: 2011-11-23 | Discharge: 2011-11-23 | Disposition: A | Discharge status: Home / Self Care | Payer: Medicare Other | Attending: Emergency Medicine | Admitting: Emergency Medicine

## 2011-11-23 ENCOUNTER — Encounter (HOSPITAL_COMMUNITY): Payer: Self-pay | Admitting: *Deleted

## 2011-11-23 DIAGNOSIS — N390 Urinary tract infection, site not specified: Secondary | ICD-10-CM | POA: Insufficient documentation

## 2011-11-23 DIAGNOSIS — N200 Calculus of kidney: Secondary | ICD-10-CM

## 2011-11-23 DIAGNOSIS — M4120 Other idiopathic scoliosis, site unspecified: Secondary | ICD-10-CM | POA: Insufficient documentation

## 2011-11-23 DIAGNOSIS — F17200 Nicotine dependence, unspecified, uncomplicated: Secondary | ICD-10-CM | POA: Insufficient documentation

## 2011-11-23 LAB — URINALYSIS, ROUTINE W REFLEX MICROSCOPIC
Glucose, UA: NEGATIVE mg/dL
Protein, ur: 30 mg/dL — AB
Specific Gravity, Urine: 1.02 (ref 1.005–1.030)
Urobilinogen, UA: 0.2 mg/dL (ref 0.0–1.0)

## 2011-11-23 LAB — URINE MICROSCOPIC-ADD ON

## 2011-11-23 MED ORDER — CIPROFLOXACIN IN D5W 400 MG/200ML IV SOLN
400.0000 mg | Freq: Once | INTRAVENOUS | Status: AC
  Administered 2011-11-23: 400 mg via INTRAVENOUS
  Filled 2011-11-23: qty 200

## 2011-11-23 MED ORDER — HYDROMORPHONE HCL PF 1 MG/ML IJ SOLN
1.0000 mg | Freq: Once | INTRAMUSCULAR | Status: AC
  Administered 2011-11-23: 1 mg via INTRAVENOUS
  Filled 2011-11-23: qty 1

## 2011-11-23 MED ORDER — CIPROFLOXACIN HCL 500 MG PO TABS: 500.0000 mg | ORAL_TABLET | Freq: Two times a day (BID) | ORAL | Status: AC

## 2011-11-23 MED ORDER — ONDANSETRON HCL 4 MG/2ML IJ SOLN
4.0000 mg | Freq: Once | INTRAMUSCULAR | Status: AC
  Administered 2011-11-23: 4 mg via INTRAVENOUS
  Filled 2011-11-23: qty 2

## 2011-11-23 MED ORDER — OXYCODONE-ACETAMINOPHEN 5-325 MG PO TABS: 1.0000 | ORAL_TABLET | Freq: Four times a day (QID) | ORAL | Status: AC | PRN

## 2011-11-23 MED ORDER — KETOROLAC TROMETHAMINE 30 MG/ML IJ SOLN
30.0000 mg | Freq: Once | INTRAMUSCULAR | Status: AC
  Administered 2011-11-23: 30 mg via INTRAVENOUS
  Filled 2011-11-23: qty 1

## 2011-11-23 MED ORDER — SODIUM CHLORIDE 0.9 % IV SOLN
Freq: Once | INTRAVENOUS | Status: AC
  Administered 2011-11-23: 09:00:00 via INTRAVENOUS

## 2011-11-23 MED ORDER — HYDROMORPHONE HCL PF 2 MG/ML IJ SOLN
2.0000 mg | Freq: Once | INTRAMUSCULAR | Status: AC
  Administered 2011-11-23: 2 mg via INTRAVENOUS
  Filled 2011-11-23: qty 1

## 2011-11-23 NOTE — ED Notes (Signed)
Patient was given a rag to wash off with after her pelvic exam. Patient does not need anything else at this time.

## 2011-11-23 NOTE — ED Notes (Signed)
Pt c/o pain in vagina. Pt states pain increases with urination and she is having difficulty urinating.

## 2011-11-23 NOTE — ED Provider Notes (Signed)
History  This chart was scribed for Heidi Lennert, MD by Bennett Scrape. This patient was seen in room APA08/[facility]08 and the patient's care was started at 8:47AM.  CSN: [id number]  Arrival date & time 11/23/11  4098   First MD Initiated Contact with Patient 11/23/11 [phone]      Chief Complaint  Patient presents with  . Vaginal Pain     Patient is a 36 y.o. female presenting with vaginal pain.  Vaginal Pain This is a new problem. The current episode started 2 days ago. The problem occurs constantly. The problem has been gradually worsening. Pertinent negatives include no chest pain, no abdominal pain, no headaches and no shortness of breath. The symptoms are aggravated by nothing. The symptoms are relieved by position. She has tried acetaminophen and ASA for the symptoms. The treatment provided no relief.    GENIENE LIST is a 36 y.o. female with a h/o renal disease who presents to the Emergency Department complaining of 2 days of gradual onset, gradually worsening urethral pain with associated hematuria and difficulty urinating. Pt states that the pain was waxing and waning but has been constant that past 2 hours. Pt states that laying with her knees bent upward improve the pain. She reports that she has tried Endocet and tylenol with no improvement in the pain.  Pt was diagnosed with a kidney stone and kidney infection in this ED 5 days ago and was prescribed keflex. Pt states that she hasn't passed a stone since being discharged. Pt states that she has had 25 kidney stones in the past. Pt is a smoker but denies alcohol.  Past Medical History  Diagnosis Date  . Scoliosis (and kyphoscoliosis), idiopathic   . Arthritis   . Renal disorder     Past Surgical History  Procedure Date  . Back surgery   . Breast surgery     History reviewed. No pertinent family history.  History  Substance Use Topics  . Smoking status: Current Everyday Smoker  . Smokeless tobacco: Not on file  .  Alcohol Use: No    OB History    Grav Para Term Preterm Abortions TAB SAB Ect Mult Living                  Review of Systems  Constitutional: Negative for fatigue.  HENT: Negative for congestion, sinus pressure and ear discharge.   Eyes: Negative for discharge.  Respiratory: Negative for cough and shortness of breath.   Cardiovascular: Negative for chest pain.  Gastrointestinal: Negative for abdominal pain and diarrhea.  Genitourinary: Positive for hematuria, difficulty urinating and vaginal pain. Negative for frequency.  Musculoskeletal: Negative for back pain.  Skin: Negative for rash.  Neurological: Negative for seizures and headaches.  Hematological: Negative.   Psychiatric/Behavioral: Negative for hallucinations.    Allergies  Flagyl and Morphine and related  Home Medications   Current Outpatient Rx  Name Route Sig Dispense Refill  . ACETAMINOPHEN 500 MG PO TABS Oral Take 1,000 mg by mouth every 6 (six) hours as needed. For pain     . ALPRAZOLAM 0.5 MG PO TABS Oral Take 0.5 mg by mouth daily as needed. For anxiety     . AMPHETAMINE-DEXTROAMPHETAMINE 30 MG PO TABS Oral Take 30 mg by mouth daily.      . BC HEADACHE POWDER PO Oral Take 1 packet by mouth as needed. For pain    . CEPHALEXIN 500 MG PO CAPS Oral Take 2 capsules (1,000 mg total) by  mouth 2 (two) times daily. 28 capsule 0  . DICLOFENAC SODIUM 1 % TD GEL Topical Apply 1 application topically as needed. For pain    . FENTANYL 25 MCG/HR TD PT72 Transdermal Place 1 patch onto the skin every 3 (three) days.     . FUROSEMIDE 40 MG PO TABS Oral Take 40 mg by mouth daily as needed. For FLUID RETENTION    . ONDANSETRON HCL 4 MG PO TABS Oral Take 1 tablet (4 mg total) by mouth every 8 (eight) hours as needed for nausea. 12 tablet 0  . CIPROFLOXACIN HCL 500 MG PO TABS Oral Take 1 tablet (500 mg total) by mouth 2 (two) times daily. 14 tablet 0  . OXYCODONE-ACETAMINOPHEN 5-325 MG PO TABS Oral Take 1 tablet by mouth every 6  (six) hours as needed for pain. 20 tablet 0    Triage Vitals: BP 118/81  Pulse 122  Temp(Src) 97.5 F (36.4 C) (Oral)  Resp 18  Ht 5' 1.5" (1.562 m)  Wt 129 lb (58.514 kg)  BMI 23.98 kg/m2  SpO2 100%  LMP 10/22/2011  Physical Exam  Nursing note and vitals reviewed. Constitutional: She is oriented to person, place, and time. She appears well-developed and well-nourished.  HENT:  Head: Normocephalic and atraumatic.  Eyes: Conjunctivae and EOM are normal. No scleral icterus.  Neck: Neck supple. No thyromegaly present.  Cardiovascular: Normal rate and regular rhythm.  Exam reveals no gallop and no friction rub.   No murmur heard. Pulmonary/Chest: Effort normal and breath sounds normal. No stridor. She has no wheezes. She has no rales. She exhibits no tenderness.  Abdominal: She exhibits no distension. There is no tenderness. There is no rebound.  Musculoskeletal: Normal range of motion. She exhibits no edema.  Lymphadenopathy:    She has no cervical adenopathy.  Neurological: She is alert and oriented to person, place, and time. Coordination normal.  Skin: Skin is warm and dry. No rash noted. No erythema.  Psychiatric: She has a normal mood and affect. Her behavior is normal.    ED Course  Procedures (including critical care time)  DIAGNOSTIC STUDIES: Oxygen Saturation is 100% on room air, normal by my interpretation.    COORDINATION OF CARE: 8:50AM-Discussed treatment plan with pt at bedside and pt agreed to plan. 10:06AM-Pt rechecked and is still uncomfortable. Will give pt more pain medication and Flomax. Will discharge home with antibiotic, Flomax and pain medication. 12:01PM-Pt rechecked and is feeling better. All labs and x-rays reviewed with patient and discharge plan discussed. Will write a prescription for oxycodone and Cipro. Pt ordered to stop the Keflex. Advised pt to follow-up with her urologist.   Labs Reviewed  URINALYSIS, ROUTINE W REFLEX MICROSCOPIC -  Abnormal; Notable for the following:    Color, Urine AMBER (*) BIOCHEMICALS MAY BE AFFECTED BY COLOR   APPearance CLOUDY (*)    Hgb urine dipstick LARGE (*)    Bilirubin Urine SMALL (*)    Protein, ur 30 (*)    All other components within normal limits  URINE MICROSCOPIC-ADD ON - Abnormal; Notable for the following:    Squamous Epithelial / LPF FEW (*)    Bacteria, UA MANY (*)    All other components within normal limits  URINE CULTURE  GC/CHLAMYDIA PROBE AMP, GENITAL   Dg Abd 1 View  11/23/2011  *RADIOLOGY REPORT*  Clinical Data: Dysuria, history of kidney stonesa  ABDOMEN - 1 VIEW  Comparison: 11/18/2011  Findings: Nonobstructive bowel gas pattern.  Possible 6 mm  calculus overlying the left kidney.  Degenerate changes with stable spinal fixation.  IMPRESSION: Possible 6 mm calculus overlying the left kidney.  Original Report Authenticated By: Charline Bills, M.D.     1. Kidney stone   2. uti    MDM        The chart was scribed for me under my direct supervision.  I personally performed the history, physical, and medical decision making and all procedures in the evaluation of this patient.Heidi Lennert, MD 11/23/11 [phone]

## 2011-11-23 NOTE — ED Notes (Signed)
Pt c/o pain, burning and pressure "where I pee". Pt denies abdominal or back pain. Pt c/o nausea and dizziness at times. Pt states that she was seen on 11/18/11 for same. Alert and oriented x 3. Skin warm and dry. Color pink. States that symptoms got worse 2 days ago. Still taking her antibiotics as prescribed.

## 2011-11-24 LAB — GC/CHLAMYDIA PROBE AMP, GENITAL: Chlamydia, DNA Probe: NEGATIVE

## 2011-11-25 LAB — URINE CULTURE: Culture: NO GROWTH

## 2011-12-05 ENCOUNTER — Encounter: Payer: Medicare Other | Attending: Physical Medicine & Rehabilitation | Admitting: Neurosurgery

## 2011-12-05 DIAGNOSIS — IMO0001 Reserved for inherently not codable concepts without codable children: Secondary | ICD-10-CM | POA: Insufficient documentation

## 2011-12-05 DIAGNOSIS — M545 Low back pain: Secondary | ICD-10-CM

## 2011-12-05 DIAGNOSIS — G894 Chronic pain syndrome: Secondary | ICD-10-CM

## 2011-12-05 DIAGNOSIS — M549 Dorsalgia, unspecified: Secondary | ICD-10-CM | POA: Insufficient documentation

## 2011-12-05 DIAGNOSIS — M4120 Other idiopathic scoliosis, site unspecified: Secondary | ICD-10-CM | POA: Insufficient documentation

## 2011-12-05 DIAGNOSIS — Z981 Arthrodesis status: Secondary | ICD-10-CM | POA: Insufficient documentation

## 2011-12-05 DIAGNOSIS — M79609 Pain in unspecified limb: Secondary | ICD-10-CM | POA: Insufficient documentation

## 2011-12-05 DIAGNOSIS — M47812 Spondylosis without myelopathy or radiculopathy, cervical region: Secondary | ICD-10-CM | POA: Insufficient documentation

## 2011-12-06 NOTE — Assessment & Plan Note (Signed)
This is patient of Dr. Riley Kill seen for back and leg pain.  She has been treated by Dr. Alveda Reasons who supposedly going to operate on her back again. There has been some controversy today over her prescriptions.  There were several providers on the [facility] Controlled Substance Reporting system that had given her medicines since December.  I did speak with Dr. Riley Kill who recommended discharge from the clinic.  When I told the patient that, she states that Dr. Riley Kill told her it is okay to do all this with the other providers.  Again, I told her I would not schedule another appointment with her, and she can call and discuss this with Dr. Riley Kill next week.  She rates her average pain as 7 or 8. General activity level is 7.  The pain is worse in the morning and evening.  Walking, bending, standing, and activity aggravate.  Rest and medications help.  She walks with and without assistance.  She does not climb steps.  She does drive.  She is on disability.  Needs help with ADLs.  REVIEW OF SYSTEMS:  Notable for difficulties described above as well as some paresthesias, trouble walking, anxiety.  No suicidal thoughts, though very questionable aberrant behavior.  PAST MEDICAL HISTORY, SOCIAL HISTORY, AND FAMILY HISTORY:  Unchanged.  PHYSICAL EXAM:  Blood pressure is 112/74, pulse is 95, respirations 16, O2 saturations 100 on room air.  Motor strength, sensation intact. Constitutional is within normal limits.  She is alert and oriented x3. She has a slight limp.  ASSESSMENTS: 1. History of anterior L5-S1 fusion. 2. Scoliosis. 3. Myofascial pain. 4. Cervical spondylosis.  PLAN:  Refilled oxycodone 7.5/325 one p.o. q.6 h. p.r.n., 30 day supply. Discharged from clinic.  Final disposition is up to Dr. Riley Kill.     Myndi Wamble L. Blima Dessert     Ranelle Oyster, M.D. Electronically Signed   RLW/MedQ D:  12/05/2011 13:30:12  T:  12/06/2011 05:25:01  Job #:  [id number]

## 2011-12-31 ENCOUNTER — Other Ambulatory Visit: Payer: Self-pay | Admitting: Orthopedic Surgery

## 2012-01-15 ENCOUNTER — Ambulatory Visit: Payer: Self-pay

## 2012-01-15 LAB — URINALYSIS, COMPLETE
Bilirubin,UR: NEGATIVE
Glucose,UR: NEGATIVE mg/dL (ref 0–75)
Nitrite: POSITIVE
Specific Gravity: 1.02 (ref 1.003–1.030)

## 2012-03-06 ENCOUNTER — Encounter (HOSPITAL_COMMUNITY): Payer: Self-pay

## 2012-03-06 ENCOUNTER — Emergency Department (HOSPITAL_COMMUNITY)
Admission: EM | Admit: 2012-03-06 | Discharge: 2012-03-06 | Disposition: A | Discharge status: Home / Self Care | Payer: Medicare Other | Attending: Emergency Medicine | Admitting: Emergency Medicine

## 2012-03-06 ENCOUNTER — Emergency Department (HOSPITAL_COMMUNITY): Payer: Medicare Other

## 2012-03-06 DIAGNOSIS — R3915 Urgency of urination: Secondary | ICD-10-CM | POA: Insufficient documentation

## 2012-03-06 DIAGNOSIS — M545 Low back pain, unspecified: Secondary | ICD-10-CM | POA: Insufficient documentation

## 2012-03-06 DIAGNOSIS — R350 Frequency of micturition: Secondary | ICD-10-CM | POA: Insufficient documentation

## 2012-03-06 DIAGNOSIS — R1032 Left lower quadrant pain: Secondary | ICD-10-CM | POA: Insufficient documentation

## 2012-03-06 DIAGNOSIS — R300 Dysuria: Secondary | ICD-10-CM | POA: Insufficient documentation

## 2012-03-06 DIAGNOSIS — R319 Hematuria, unspecified: Secondary | ICD-10-CM | POA: Insufficient documentation

## 2012-03-06 LAB — URINE MICROSCOPIC-ADD ON

## 2012-03-06 LAB — URINALYSIS, ROUTINE W REFLEX MICROSCOPIC
Bilirubin Urine: NEGATIVE
Glucose, UA: NEGATIVE mg/dL
Ketones, ur: NEGATIVE mg/dL
pH: 6.5 (ref 5.0–8.0)

## 2012-03-06 MED ORDER — HYDROMORPHONE HCL PF 2 MG/ML IJ SOLN
2.0000 mg | Freq: Once | INTRAMUSCULAR | Status: AC
  Administered 2012-03-06: 2 mg via INTRAMUSCULAR
  Filled 2012-03-06: qty 1

## 2012-03-06 MED ORDER — OXYCODONE-ACETAMINOPHEN 7.5-325 MG PO TABS: 1.0000 | ORAL_TABLET | Freq: Four times a day (QID) | ORAL | Status: AC | PRN

## 2012-03-06 MED ORDER — NITROFURANTOIN MONOHYD MACRO 100 MG PO CAPS: 100.0000 mg | ORAL_CAPSULE | Freq: Two times a day (BID) | ORAL | Status: AC

## 2012-03-06 MED ORDER — ONDANSETRON 4 MG PO TBDP
4.0000 mg | ORAL_TABLET | Freq: Once | ORAL | Status: AC
  Administered 2012-03-06: 4 mg via ORAL
  Filled 2012-03-06: qty 1

## 2012-03-06 MED ORDER — NITROFURANTOIN MONOHYD MACRO 100 MG PO CAPS
100.0000 mg | ORAL_CAPSULE | Freq: Once | ORAL | Status: AC
  Administered 2012-03-06: 100 mg via ORAL
  Filled 2012-03-06: qty 1

## 2012-03-06 NOTE — ED Provider Notes (Signed)
History     CSN: [id number]  Arrival date & time 03/06/12  7846   First MD Initiated Contact with Patient 03/06/12 [phone]      Chief Complaint  Patient presents with  . Back Pain  . Dysuria    (Consider location/radiation/quality/duration/timing/severity/associated sxs/prior treatment) HPI Comments: Pt has dysuria,  Hematuria, frequency and urgency.  She was recently seen at an urgent care in the [place] area when she went to visit her parents.  She has been taking bactrim without improvement.  She has a h/o of "medullary sponge kidney" with frequent UTI's and kidney stones.  Her PCP is dr. Loney Hering and her urologist is dr. Rito Ehrlich.  Last CT scan here was in 2009 and showed multiple stones in B kidneys.  She had an abdominal film ~ 4 months ago which the radiologist saw an ~ 6 mm stone in the L kidney.  She has constant lumbar back .  Harrington rods placement and R one currently broken.  On fentanyl for chronic pain.  Patient is a 36 y.o. female presenting with back pain and dysuria. The history is provided by the patient. No language interpreter was used.  Back Pain  This is a new problem. Episode onset: 4-5 days ago. The problem occurs constantly. The problem has not changed since onset.The pain is associated with no known injury. The pain is present in the lumbar spine. The quality of the pain is described as stabbing. The pain does not radiate. The pain is the same all the time. Associated symptoms include dysuria. Pertinent negatives include no fever.  Dysuria  Associated symptoms include frequency, hematuria and urgency.    Past Medical History  Diagnosis Date  . Scoliosis (and kyphoscoliosis), idiopathic   . Arthritis   . Renal disorder     Past Surgical History  Procedure Date  . Back surgery   . Breast surgery     No family history on file.  History  Substance Use Topics  . Smoking status: Current Everyday Smoker  . Smokeless tobacco: Not on file  . Alcohol Use: No     OB History    Grav Para Term Preterm Abortions TAB SAB Ect Mult Living                  Review of Systems  Constitutional: Negative for fever.  Genitourinary: Positive for dysuria, urgency, frequency and hematuria.  Musculoskeletal: Positive for back pain.  All other systems reviewed and are negative.    Allergies  Flagyl and Morphine and related  Home Medications   Current Outpatient Rx  Name Route Sig Dispense Refill  . ACETAMINOPHEN 500 MG PO TABS Oral Take 500-1,000 mg by mouth every 6 (six) hours as needed. For pain    . ALPRAZOLAM 0.5 MG PO TABS Oral Take 0.5 mg by mouth daily as needed. For anxiety     . BC HEADACHE POWDER PO Oral Take 1 packet by mouth as needed. For pain    . DICLOFENAC SODIUM 1 % TD GEL Topical Apply 1 application topically as needed. For pain    . FENTANYL 50 MCG/HR TD PT72 Transdermal Place 1 patch onto the skin every 3 (three) days.    . FUROSEMIDE 40 MG PO TABS Oral Take 40 mg by mouth as needed. For fluid    . OXYCODONE-ACETAMINOPHEN 10-325 MG PO TABS Oral Take 1 tablet by mouth every 4 (four) hours as needed. For pain    . SULFAMETHOXAZOLE-TMP DS 800-160 MG PO TABS  Oral Take 1 tablet by mouth 2 (two) times daily. For 10 days. Started on 02/27/12    . NITROFURANTOIN MONOHYD MACRO 100 MG PO CAPS Oral Take 1 capsule (100 mg total) by mouth 2 (two) times daily. 10 capsule 0  . OXYCODONE-ACETAMINOPHEN 7.5-325 MG PO TABS Oral Take 1 tablet by mouth every 6 (six) hours as needed for pain. 20 tablet 0    BP 121/81  Pulse 87  Temp(Src) 98.1 F (36.7 C) (Oral)  Resp 18  Ht 5\' 2"  (1.575 m)  Wt 125 lb (56.7 kg)  BMI 22.86 kg/m2  SpO2 98%  LMP 02/17/2012  Physical Exam  Nursing note and vitals reviewed. Constitutional: She is oriented to person, place, and time. She appears well-developed and well-nourished. No distress.  HENT:  Head: Normocephalic and atraumatic.  Eyes: EOM are normal.  Neck: Normal range of motion.  Cardiovascular: Normal  rate, regular rhythm and normal heart sounds.   Pulmonary/Chest: Effort normal and breath sounds normal.  Abdominal: Soft. She exhibits no distension. There is no tenderness.    Musculoskeletal: Normal range of motion.       Arms: Neurological: She is alert and oriented to person, place, and time. Coordination normal.  Skin: Skin is warm and dry.  Psychiatric: She has a normal mood and affect. Judgment normal.    ED Course  Procedures (including critical care time)  Labs Reviewed  URINALYSIS, ROUTINE W REFLEX MICROSCOPIC - Abnormal; Notable for the following:    Color, Urine RED (*) BIOCHEMICALS MAY BE AFFECTED BY COLOR   APPearance HAZY (*)    Specific Gravity, Urine <1.005 (*)    Hgb urine dipstick LARGE (*)    Protein, ur TRACE (*)    All other components within normal limits  URINE MICROSCOPIC-ADD ON - Abnormal; Notable for the following:    Squamous Epithelial / LPF FEW (*)    Bacteria, UA FEW (*)    All other components within normal limits  PREGNANCY, URINE  URINE CULTURE   Ct Abdomen Pelvis Wo Contrast  03/06/2012  *RADIOLOGY REPORT*  Clinical Data: Lower abdominal pain.  Lower back pain since Sunday. Burning with urination.  Hematuria, nausea.  History of renal disease, bilateral flank pain.  CT ABDOMEN AND PELVIS WITHOUT CONTRAST  Technique:  Multidetector CT imaging of the abdomen and pelvis was performed following the standard protocol without intravenous contrast.  Comparison: CT of the abdomen and pelvis 11/21/2007  Findings: Images of the lung bases are unremarkable.  The patient has bilateral medullary nephrocalcinosis.  Intrarenal calculi are identified bilaterally.  Within the lower pole of the left kidney, the largest of these measures approximately 5 mm.  No evidence for ureteral stones.  Within the pelvis, there are multiple scattered calcifications, none of which appear to be related to the ureter.  These are favored to represent phleboliths. No evidence for  hydronephrosis.  No focal abnormality identified within the liver, spleen, pancreas, adrenal glands.  The gallbladder is present.  The stomach and visualized small bowel loops have a normal appearance.  The level of the there is moderate stool throughout the colon.  No evidence for colonic wall thickening or obstruction.  There is significant streak artifact from spinal hardware.  Patient has had posterior fusion at multiple levels.  The patient has a scoliosis, convex to the left.  The uterus is present.  Left adnexal low attenuation lesion is 3.8 cm, favored to represent left ovarian follicles / cysts.  IMPRESSION:  1.  Bilateral  medullary nephrocalcinosis. 2.  Intrarenal calculi. 3.  No evidence for ureteral stones or hydronephrosis. 4.  Significant streak artifact from spinal hardware. 5.  Probable left ovarian follicle / cyst.  Consider pelvic ultrasound as needed.  Original Report Authenticated By: Patterson Hammersmith, M.D.     1. Dysuria   2. Hematuria   3. Low back pain       MDM  rx-percocet, 20 rx-macrobid, 10 F/u with your PCP in 2-3 days        Worthy Rancher, [place] 03/06/12 1221

## 2012-03-06 NOTE — Discharge Instructions (Signed)
Take the meds as directed.  Follow up with dr. Loney Hering as soon as possible.

## 2012-03-06 NOTE — ED Notes (Signed)
Pt reports has had lower abd pain and lower back pain since Sunday.  Reports burning with urination.  Has been on septra since last Friday.   Reports hematuria and nausea.

## 2012-03-08 LAB — URINE CULTURE

## 2012-03-08 NOTE — ED Provider Notes (Signed)
Medical screening examination/treatment/procedure(s) were performed by non-physician practitioner and as supervising physician I was immediately available for consultation/collaboration.  Erastus Bartolomei, MD 03/08/12 0530 

## 2012-03-10 ENCOUNTER — Other Ambulatory Visit: Payer: Self-pay | Admitting: Physical Medicine & Rehabilitation

## 2012-03-13 ENCOUNTER — Encounter (HOSPITAL_COMMUNITY): Payer: Self-pay | Admitting: *Deleted

## 2012-03-13 ENCOUNTER — Emergency Department (HOSPITAL_COMMUNITY)
Admission: EM | Admit: 2012-03-13 | Discharge: 2012-03-13 | Disposition: A | Discharge status: Home / Self Care | Payer: Medicare Other | Attending: Emergency Medicine | Admitting: Emergency Medicine

## 2012-03-13 DIAGNOSIS — R109 Unspecified abdominal pain: Secondary | ICD-10-CM | POA: Insufficient documentation

## 2012-03-13 DIAGNOSIS — R110 Nausea: Secondary | ICD-10-CM | POA: Insufficient documentation

## 2012-03-13 DIAGNOSIS — N200 Calculus of kidney: Secondary | ICD-10-CM

## 2012-03-13 DIAGNOSIS — R300 Dysuria: Secondary | ICD-10-CM | POA: Insufficient documentation

## 2012-03-13 DIAGNOSIS — F17200 Nicotine dependence, unspecified, uncomplicated: Secondary | ICD-10-CM | POA: Insufficient documentation

## 2012-03-13 LAB — URINE MICROSCOPIC-ADD ON

## 2012-03-13 LAB — URINALYSIS, ROUTINE W REFLEX MICROSCOPIC
Bilirubin Urine: NEGATIVE
Nitrite: NEGATIVE
Specific Gravity, Urine: 1.025 (ref 1.005–1.030)
pH: 6 (ref 5.0–8.0)

## 2012-03-13 MED ORDER — PROMETHAZINE HCL 25 MG PO TABS: 12.5000 mg | ORAL_TABLET | Freq: Four times a day (QID) | ORAL | Status: AC | PRN

## 2012-03-13 MED ORDER — SODIUM CHLORIDE 0.9 % IV BOLUS (SEPSIS)
1000.0000 mL | Freq: Once | INTRAVENOUS | Status: AC
  Administered 2012-03-13: 1000 mL via INTRAVENOUS

## 2012-03-13 MED ORDER — HYDROMORPHONE HCL PF 1 MG/ML IJ SOLN
1.0000 mg | Freq: Once | INTRAMUSCULAR | Status: AC
  Administered 2012-03-13: 1 mg via INTRAVENOUS
  Filled 2012-03-13: qty 1

## 2012-03-13 MED ORDER — ONDANSETRON HCL 4 MG/2ML IJ SOLN
4.0000 mg | Freq: Once | INTRAMUSCULAR | Status: AC
  Administered 2012-03-13: 4 mg via INTRAVENOUS
  Filled 2012-03-13: qty 2

## 2012-03-13 NOTE — Discharge Instructions (Signed)
The CT scan you had done on 03/06/2012 showed multiple stones on both sides. The most likely cause of your pain tonight is the passage of either a stone and were small pieces of a stone. Drink lots of fluids. Continue her home medications. Use the nausea medicine as needed. Followup with Dr. Rito Ehrlich.

## 2012-03-13 NOTE — ED Provider Notes (Signed)
This chart was scribed for [employer]. Colon Branch, MD by Wallis Mart. The patient was seen in room APA09/APA09 and the patient's care was started at 8:42 PM.    CSN: [id number]  Arrival date & time 03/13/12  2029   First MD Initiated Contact with Patient 03/13/12 2041      Chief Complaint  Patient presents with  . Abdominal Pain  . Nausea    (Consider location/radiation/quality/duration/timing/severity/associated sxs/prior treatment) HPI [name] is a 36 y.o. female who presents to the Emergency Department complaining of sudden onset, persistence of constant, gradually worsening, moderate to severe abd pain and nausea. Pt states that she has trouble urinating.  Denies vomiting. Pt was seen last Sat. In the ER, treated for UTI w/ macrobid, was told that she has kidney stones.  Pt is on percocet and a duragesic patch w/ no relief.  Pt is on duragesic for rod in back for scoliosis.  Pt w/ h/o renal disorder.  There are no other associated symptoms and no other alleviating or aggravating factors.   Urologist: Mellody Drown PCP: Loney Hering  Past Medical History  Diagnosis Date  . Scoliosis (and kyphoscoliosis), idiopathic   . Arthritis   . Renal disorder     Past Surgical History  Procedure Date  . Back surgery   . Breast surgery     History reviewed. No pertinent family history.  History  Substance Use Topics  . Smoking status: Current Everyday Smoker  . Smokeless tobacco: Not on file  . Alcohol Use: No    OB History    Grav Para Term Preterm Abortions TAB SAB Ect Mult Living                  Review of Systems  10 Systems reviewed and all are negative for acute change except as noted in the HPI.   Allergies  Flagyl and Morphine and related  Home Medications   Current Outpatient Rx  Name Route Sig Dispense Refill  . ACETAMINOPHEN 500 MG PO TABS Oral Take 500-1,000 mg by mouth every 6 (six) hours as needed. For pain    . ALPRAZOLAM 0.5 MG PO TABS Oral Take 0.5 mg by  mouth daily as needed. For anxiety     . BC HEADACHE POWDER PO Oral Take 1 packet by mouth as needed. For pain    . FENTANYL 50 MCG/HR TD PT72 Transdermal Place 1 patch onto the skin every 3 (three) days.    . FUROSEMIDE 40 MG PO TABS Oral Take 40 mg by mouth as needed. For fluid    . NITROFURANTOIN MONOHYD MACRO 100 MG PO CAPS Oral Take 1 capsule (100 mg total) by mouth 2 (two) times daily. 10 capsule 0  . OXYCODONE-ACETAMINOPHEN 10-325 MG PO TABS Oral Take 1 tablet by mouth every 4 (four) hours as needed. For pain    . OXYCODONE-ACETAMINOPHEN 7.5-325 MG PO TABS Oral Take 1 tablet by mouth every 6 (six) hours as needed for pain. 20 tablet 0  . SULFAMETHOXAZOLE-TMP DS 800-160 MG PO TABS Oral Take 1 tablet by mouth 2 (two) times daily. For 10 days. Started on 02/27/12    . VOLTAREN 1 % TD GEL  APPLY CREAM 3 TIMES A DAY TO LEFT SIDE OF NECK. 300 g 0    BP 120/79  Pulse 110  Temp(Src) 97.9 F (36.6 C) (Oral)  Resp 20  Ht 5\' 2"  (1.575 m)  Wt 125 lb (56.7 kg)  BMI 22.86 kg/m2  SpO2 100%  LMP 02/17/2012  Physical Exam  Nursing note and vitals reviewed. Constitutional: She is oriented to person, place, and time. She appears well-developed and well-nourished. No distress.  HENT:  Head: Normocephalic and atraumatic.  Eyes: EOM are normal. Pupils are equal, round, and reactive to light.  Neck: Normal range of motion. Neck supple. No tracheal deviation present.  Cardiovascular: Normal rate.   Pulmonary/Chest: Effort normal and breath sounds normal. No respiratory distress.  Abdominal: Soft. Bowel sounds are normal. She exhibits no distension. There is tenderness. There is no rebound and no guarding.       Focal light lower quad abd pan but no flank pain    Musculoskeletal: Normal range of motion. She exhibits no edema.  Neurological: She is alert and oriented to person, place, and time. No sensory deficit.  Skin: Skin is warm and dry.  Psychiatric: She has a normal mood and affect. Her  behavior is normal.    ED Course  Procedures (including critical care time) DIAGNOSTIC STUDIES: Oxygen Saturation is 100% on room air, normal by my interpretation.    COORDINATION OF CARE:  8:51 PM: Pt evaluated, physical examination complete.   Labs Reviewed  URINALYSIS, ROUTINE W REFLEX MICROSCOPIC - Abnormal; Notable for the following:    APPearance HAZY (*)    Hgb urine dipstick MODERATE (*)    Ketones, ur TRACE (*)    All other components within normal limits  URINE MICROSCOPIC-ADD ON - Abnormal; Notable for the following:    Squamous Epithelial / LPF MANY (*)    Bacteria, UA FEW (*)    All other components within normal limits  PREGNANCY, URINE      MDM  Patient presents with right-sided abdominal pain with some difficulty urinating. Patient was seen in the emergency room on 03/06/2012 and a CT of the abdomen and pelvis was done showing a verbal renal stones bilaterally. She did not have any ureteral stones at that time. Current symptoms are consistent with passage of any stones and there were small pieces of stone. Repeat CT was not ordered. She was given IV fluids, analgesics, antiemetics, with some relief of discomfort. She is on chronic narcotics for back pain.Pt stable in ED with no significant deterioration in condition.The patient appears reasonably screened and/or stabilized for discharge and I doubt any other medical condition or other [facility] requiring further screening, evaluation, or treatment in the ED at this time prior to discharge.  I personally performed the services described in this documentation, which was scribed in my presence. The recorded information has been reviewed and considered.   MDM Reviewed: nursing note, vitals and previous chart Reviewed previous: CT scan           Nicoletta Dress. Colon Branch, MD 03/13/12 [phone]

## 2012-03-13 NOTE — ED Notes (Addendum)
Pt c/o lower abdominal/ pelvic pain and nausea. Pt states when she tries to urinate, she feels pressure. Pt currently taking macrobid for a uti.

## 2012-03-21 IMAGING — CR DG OR LOCAL ABDOMEN
1 series · 1 of 1 positions shown · non-contrast
Comparison: CT 06/02/2011.

CLINICAL DATA: L5-S1 cage removal with anterior fusion.  Incorrect
instrument count.

OR LOCAL ABDOMEN

[AP]
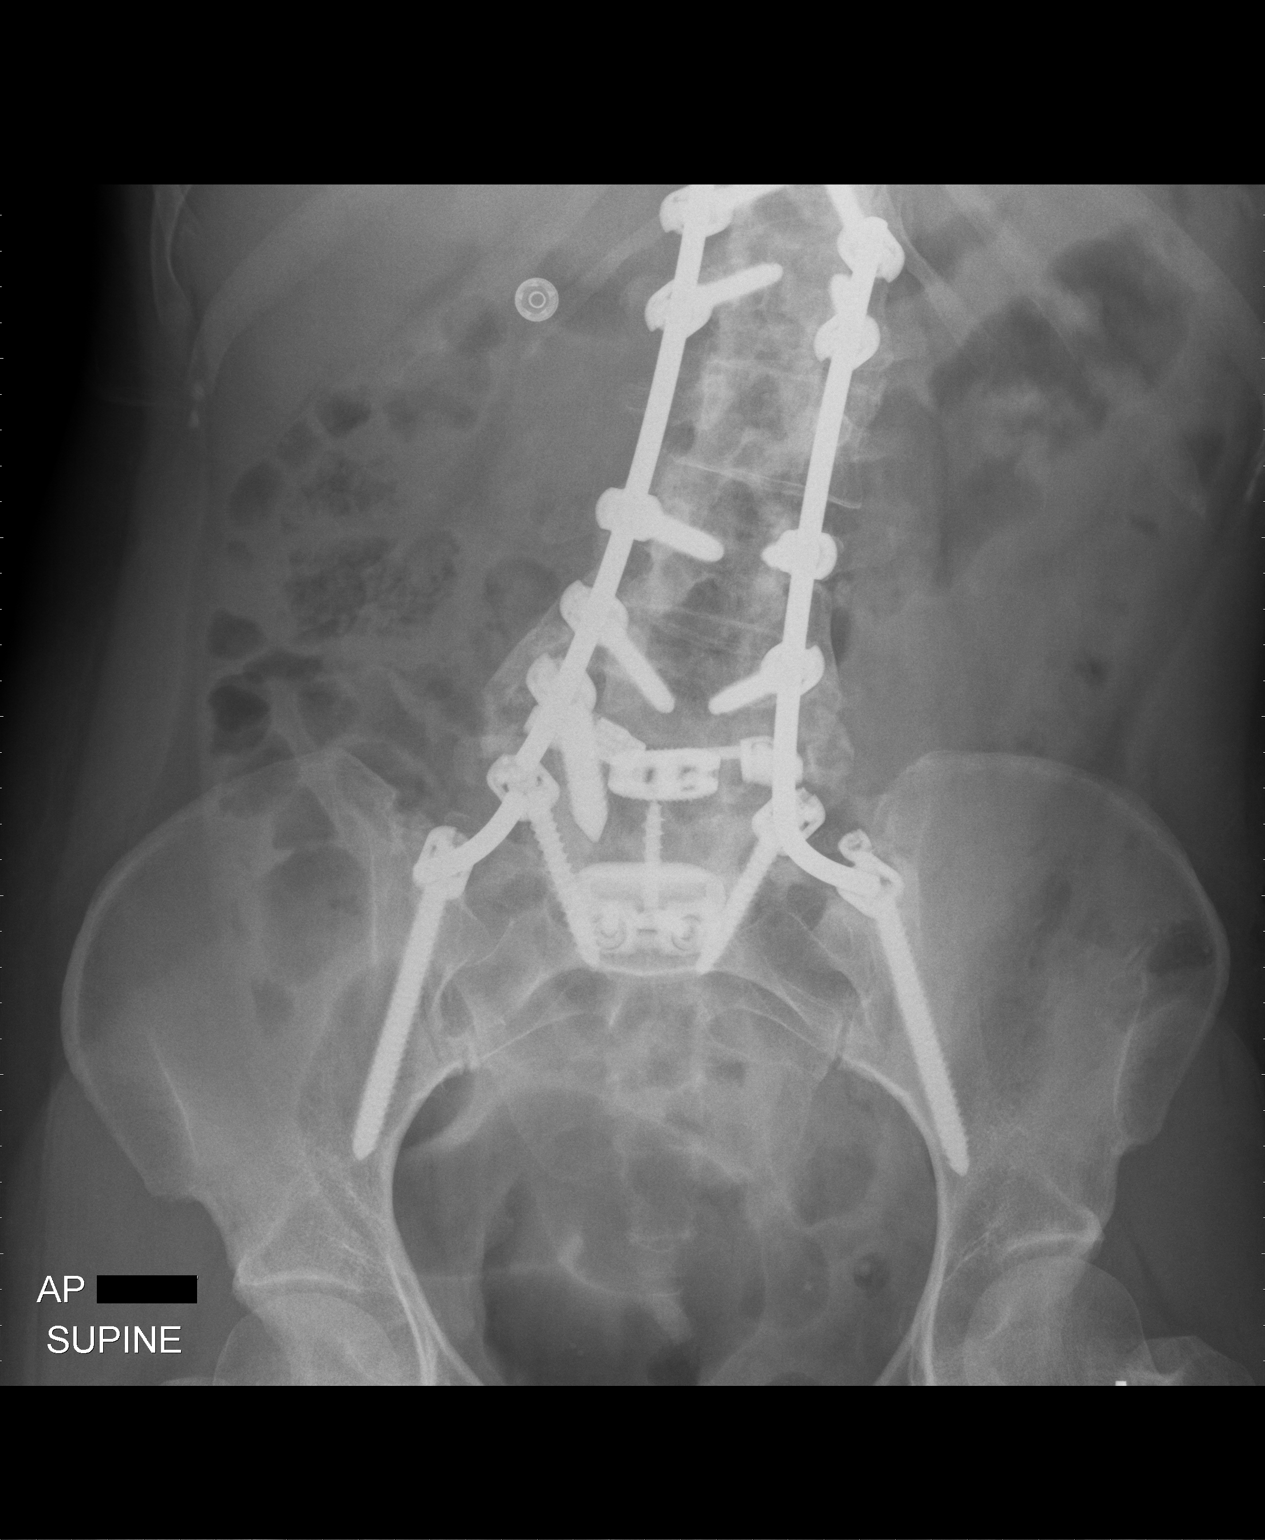

[1 of 1 positions shown; findings below may reference images not displayed]

FINDINGS: There is extensive preexisting hardware status post
fusion from T12-S1.  The pedicle screws and interconnecting rods
are unchanged in position.  Anterior interbody fusion has been
performed at L5-S1.  This hardware appears satisfactorily
positioned as evaluated in the AP projection.  No retained surgical
instruments or sponges are identified.  The alignment is stable.
IMPRESSION: Interval revision of lower lumbar fusion.  No evidence of retained
hardware or surgical sponge.

These results were called by telephone on 06/24/2011  at  4399
hours to  representative of Dr. Arissa in the operating room, who
verbally acknowledged these results.

## 2012-03-21 IMAGING — CR DG CHEST 1V PORT
1 series · 1 of 1 positions shown · non-contrast
Comparison: 06/20/2011

CLINICAL DATA: Right central line placement.

PORTABLE CHEST - 1 VIEW

[view not recorded]
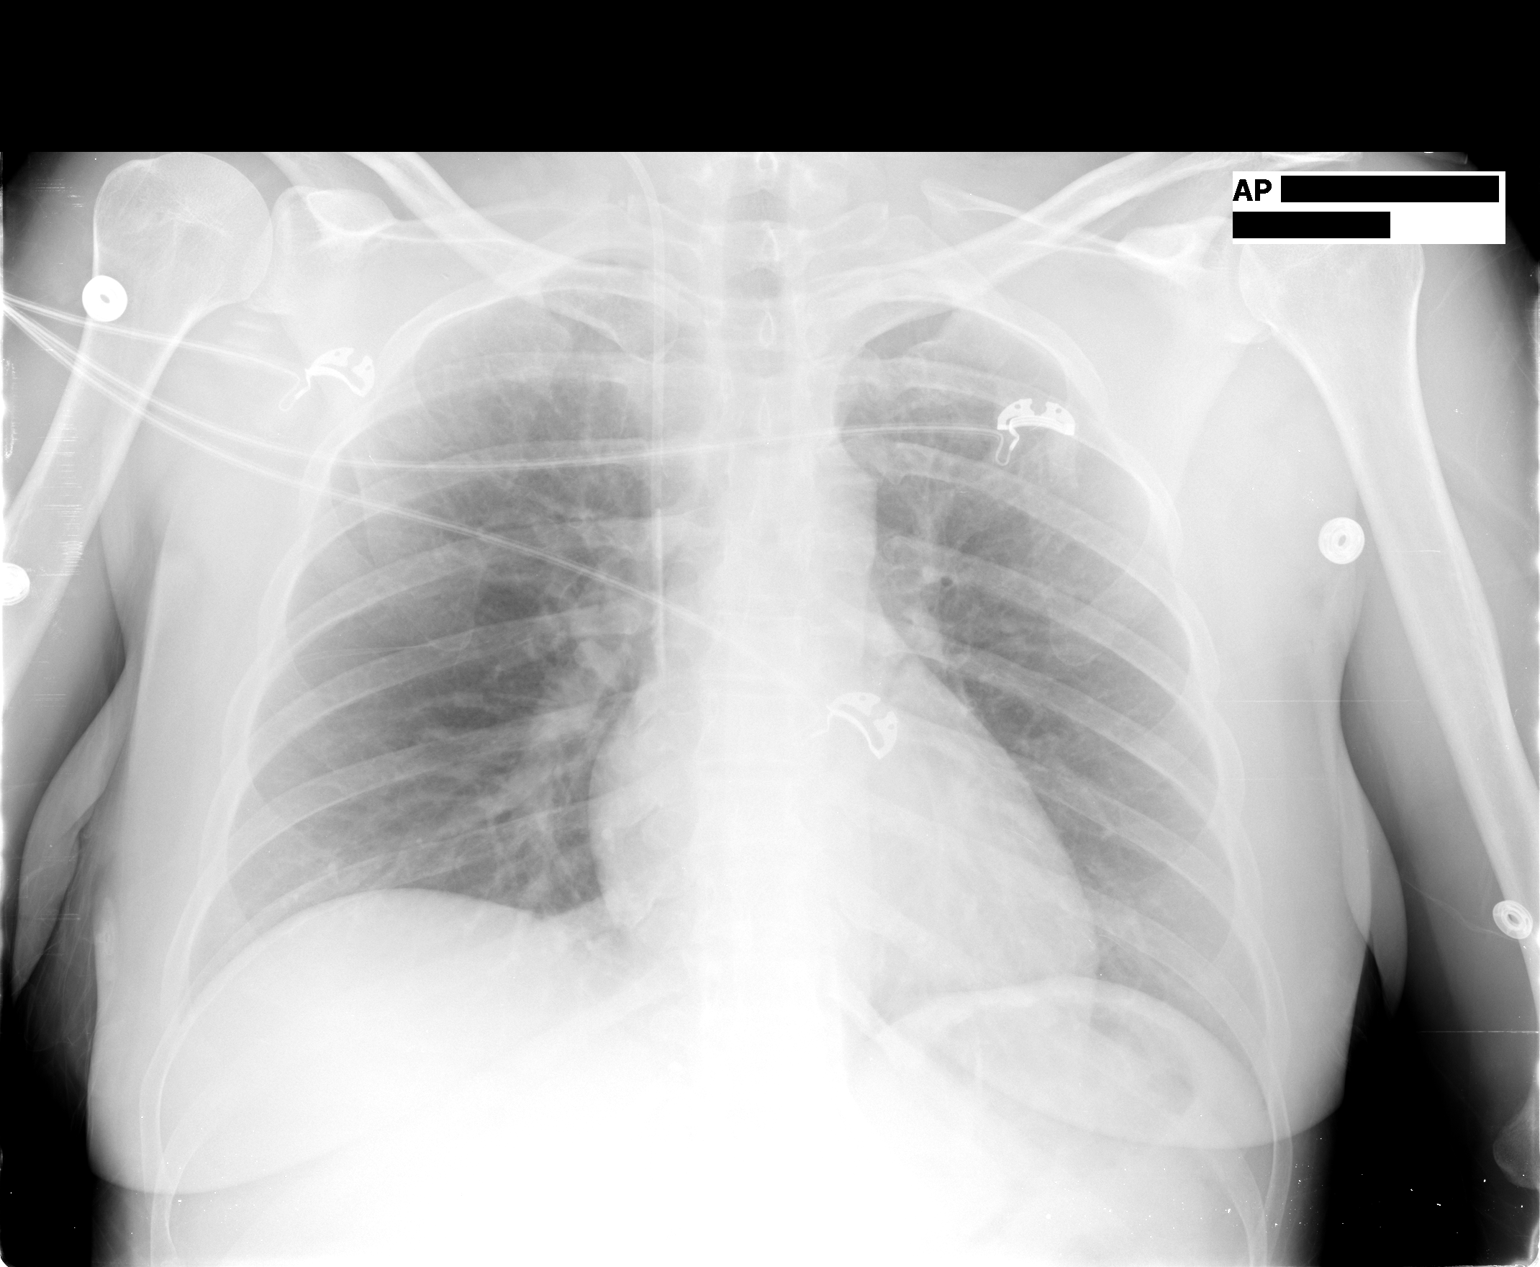

[1 of 1 positions shown; findings below may reference images not displayed]

FINDINGS: Right central line is in place with the tip at the
cavoatrial junction.  No pneumothorax. Heart and mediastinal
contours are within normal limits.  No focal opacities or
effusions.  No acute bony abnormality.
IMPRESSION: Right central line tip cavoatrial junction.  No pneumothorax.

## 2012-06-25 DIAGNOSIS — R000 Tachycardia, unspecified: Secondary | ICD-10-CM

## 2012-07-15 ENCOUNTER — Other Ambulatory Visit: Payer: Self-pay | Admitting: Physical Medicine & Rehabilitation

## 2012-07-19 IMAGING — CR DG LUMBAR SPINE COMPLETE 4+V
5 series · 5 of 5 positions shown · non-contrast
Comparison: Abdominal radiograph performed 06/24/2011, and lumbar
spine CT performed 06/02/2011

CLINICAL DATA: Fell from a squatting position, with right lower
back pain.

LUMBAR SPINE - COMPLETE 4+ VIEW

[view not recorded (1 of 5)]
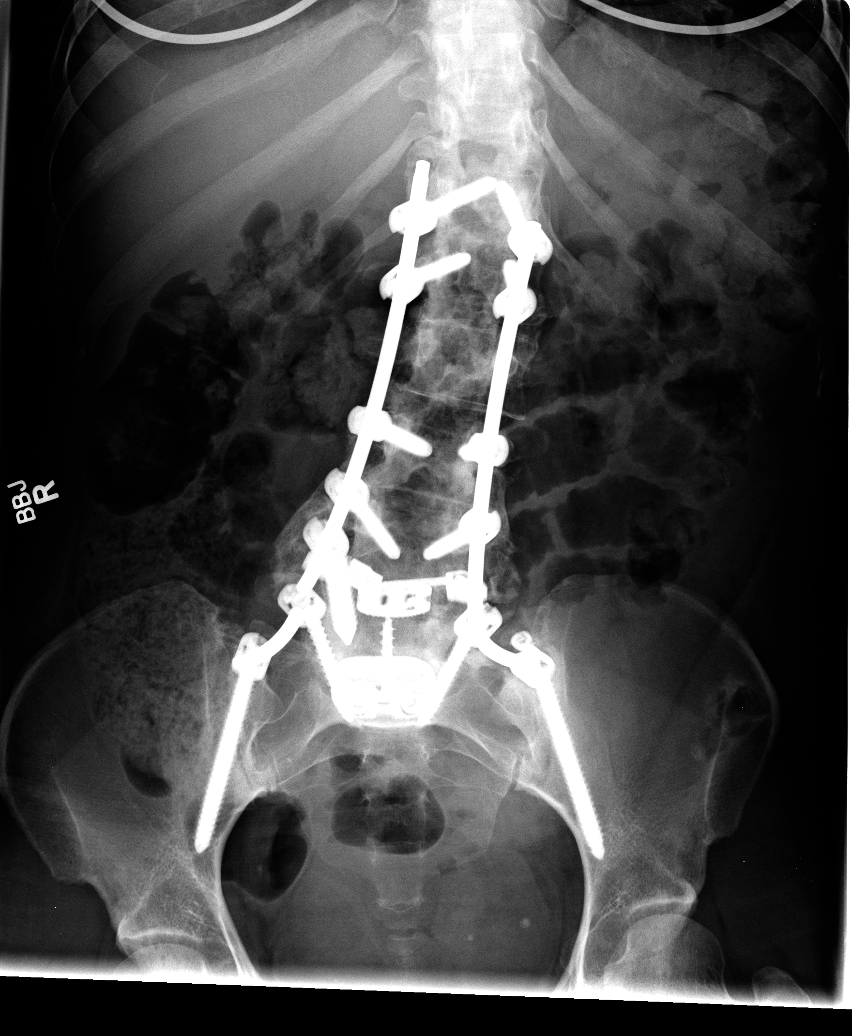

[view not recorded (2 of 5)]
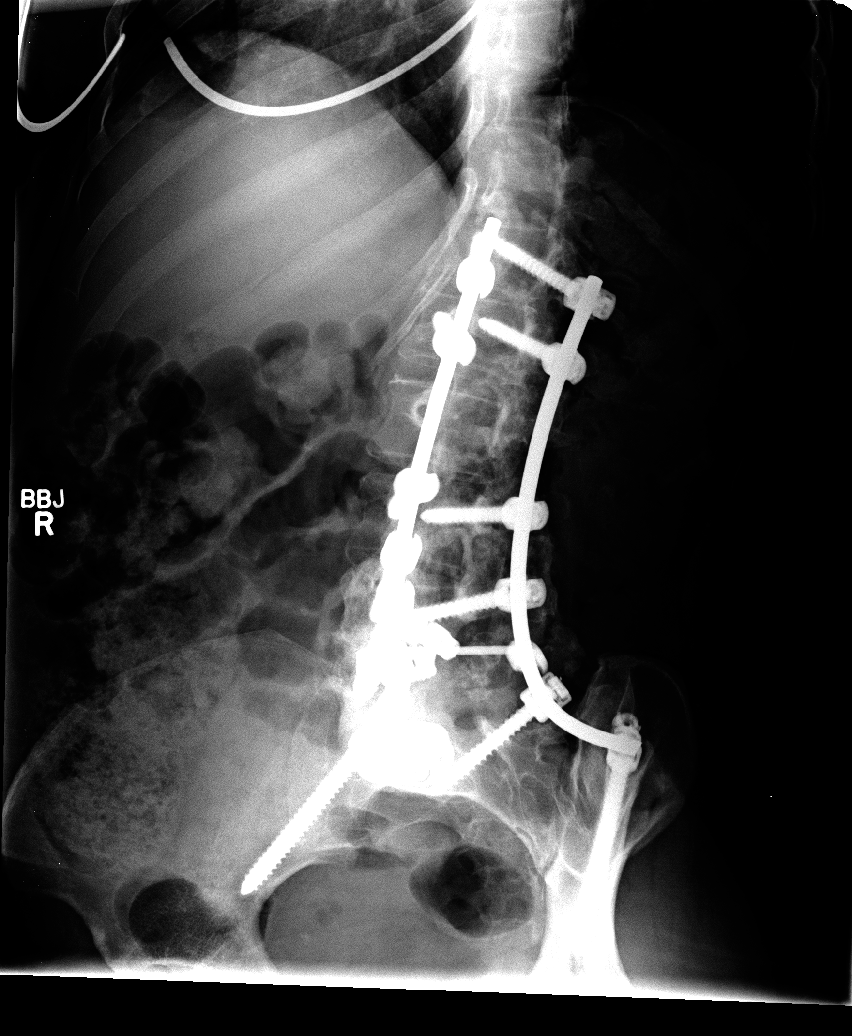

[view not recorded (3 of 5)]
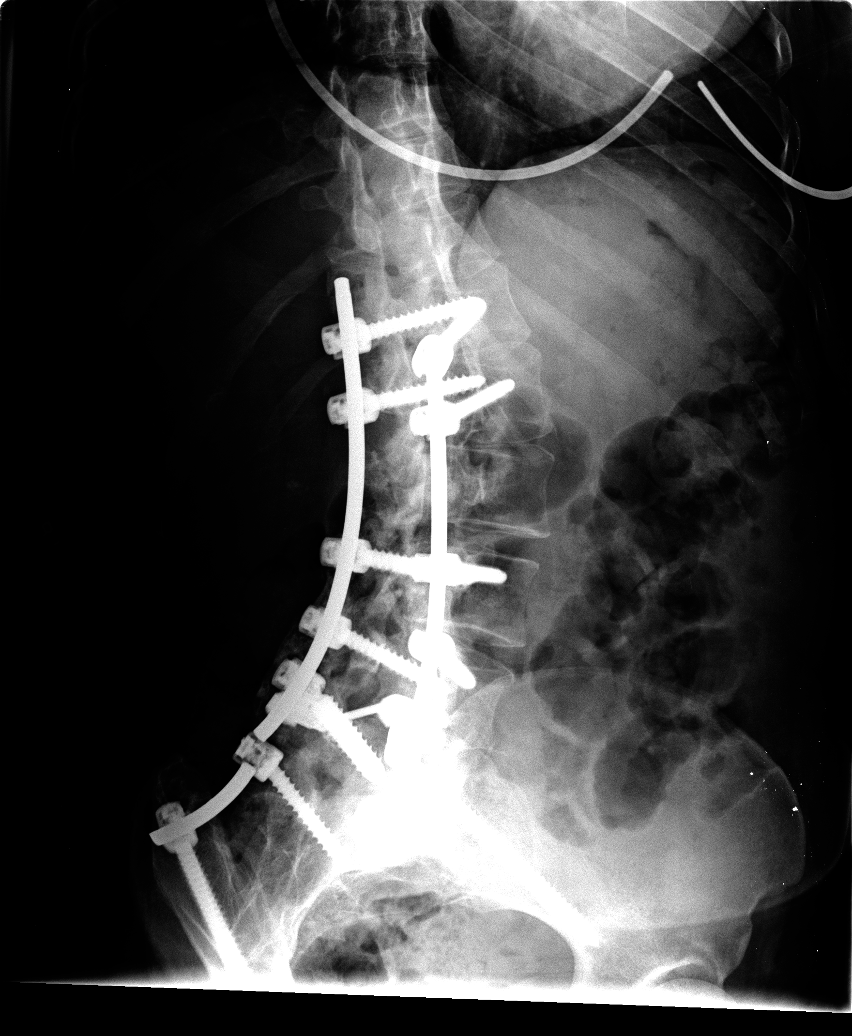

[view not recorded (4 of 5)]
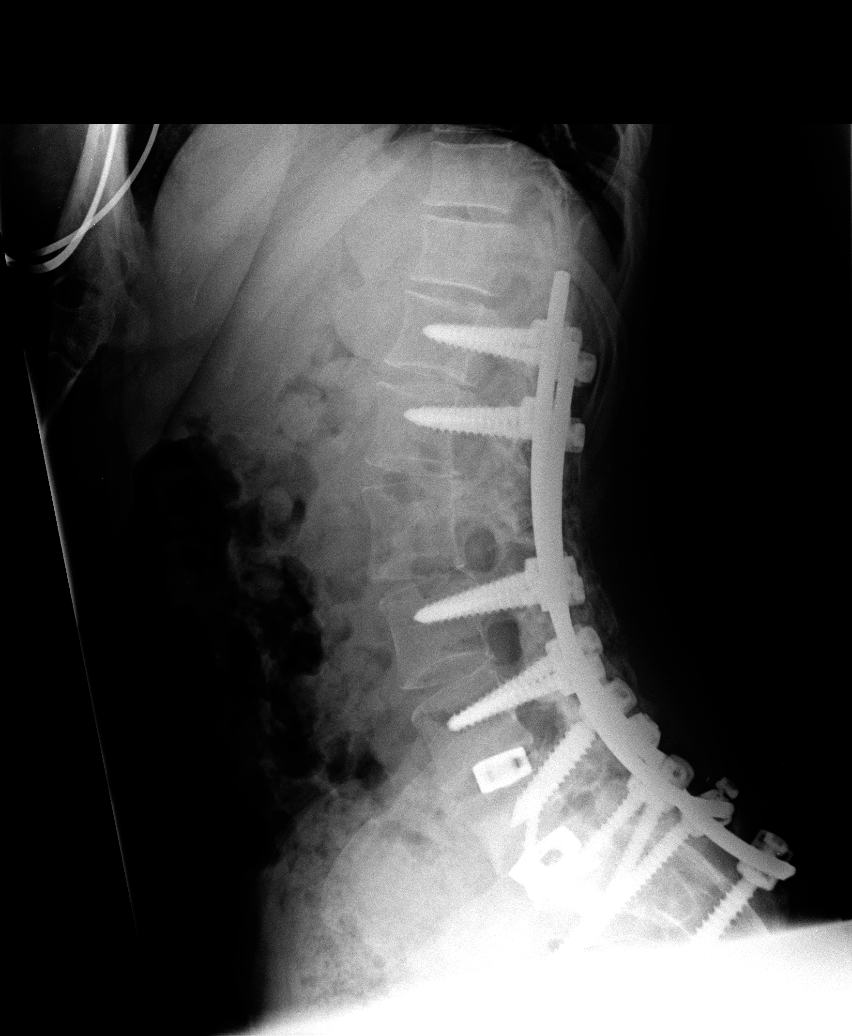

[view not recorded (5 of 5)]
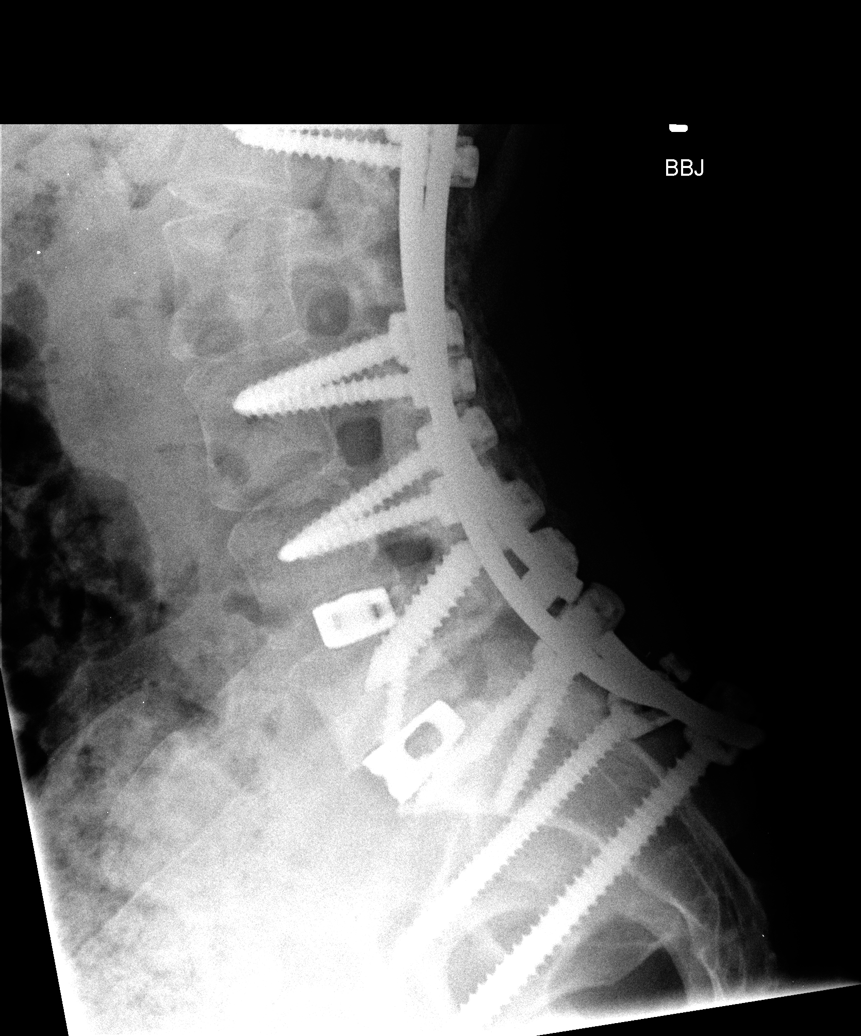

[5 of 5 positions shown; findings below may reference images not displayed]

FINDINGS: There is no evidence of fracture or subluxation.  The
patient is status post thoracolumbar spinal fusion, extending from
T12 to S1, sparing vertebral body L2.  There is also fusion at the
sacroiliac joints.   There appears to be slightly increased
distraction at the previously present fracture through the right-
sided spinal rod at the level of L5-S1.  The previously noted
fracture through the left pedicle of L4 is not well characterized
on radiograph.  Vertebral bodies demonstrate grossly normal height
and alignment.

The visualized bowel gas pattern is unremarkable in appearance; air
and stool are noted within the colon.
IMPRESSION: 1.  No definite evidence of fracture or subluxation along the
lumbar spine; previously noted fracture through the left pedicle of
L4 is not well characterized on radiograph.
2.  Apparent slightly increased distraction at the previously
present fracture through the right-sided spinal rod at the level of
L5-S1.  Thoracolumbar spinal fusion, extending from T12 to S1,
otherwise stable in appearance, with associated fusion of the
sacroiliac joints.

## 2012-08-29 ENCOUNTER — Ambulatory Visit: Payer: Self-pay | Admitting: Family Medicine

## 2012-08-29 LAB — URINALYSIS, COMPLETE
Bilirubin,UR: NEGATIVE
Glucose,UR: NEGATIVE mg/dL (ref 0–75)
Ketone: NEGATIVE

## 2012-08-31 ENCOUNTER — Encounter (HOSPITAL_COMMUNITY): Payer: Self-pay | Admitting: *Deleted

## 2012-08-31 ENCOUNTER — Emergency Department (HOSPITAL_COMMUNITY): Payer: Medicare Other

## 2012-08-31 ENCOUNTER — Emergency Department (HOSPITAL_COMMUNITY)
Admission: EM | Admit: 2012-08-31 | Discharge: 2012-08-31 | Disposition: A | Discharge status: Home / Self Care | Payer: Medicare Other | Attending: Emergency Medicine | Admitting: Emergency Medicine

## 2012-08-31 DIAGNOSIS — R109 Unspecified abdominal pain: Secondary | ICD-10-CM

## 2012-08-31 DIAGNOSIS — N390 Urinary tract infection, site not specified: Secondary | ICD-10-CM | POA: Insufficient documentation

## 2012-08-31 LAB — URINALYSIS, ROUTINE W REFLEX MICROSCOPIC
Bilirubin Urine: NEGATIVE
Glucose, UA: NEGATIVE mg/dL
Ketones, ur: NEGATIVE mg/dL
Leukocytes, UA: NEGATIVE
Protein, ur: NEGATIVE mg/dL

## 2012-08-31 LAB — URINE CULTURE

## 2012-08-31 LAB — CBC WITH DIFFERENTIAL/PLATELET
Eosinophils Absolute: 0.1 10*3/uL (ref 0.0–0.7)
Hemoglobin: 13.8 g/dL (ref 12.0–15.0)
Lymphocytes Relative: 25 % (ref 12–46)
Lymphs Abs: 2.5 10*3/uL (ref 0.7–4.0)
MCH: 31.7 pg (ref 26.0–34.0)
Monocytes Relative: 7 % (ref 3–12)
Neutro Abs: 7 10*3/uL (ref 1.7–7.7)
Neutrophils Relative %: 67 % (ref 43–77)
Platelets: 339 10*3/uL (ref 150–400)
RBC: 4.35 MIL/uL (ref 3.87–5.11)
WBC: 10.3 10*3/uL (ref 4.0–10.5)

## 2012-08-31 LAB — BASIC METABOLIC PANEL
BUN: 10 mg/dL (ref 6–23)
CO2: 25 mEq/L (ref 19–32)
Chloride: 101 mEq/L (ref 96–112)
GFR calc non Af Amer: >90 mL/min (ref >90)
Glucose, Bld: 108 mg/dL — ABNORMAL HIGH (ref 70–99)
Potassium: 3.8 mEq/L (ref 3.5–5.1)
Sodium: 136 mEq/L (ref 135–145)

## 2012-08-31 LAB — URINE MICROSCOPIC-ADD ON

## 2012-08-31 MED ORDER — SODIUM CHLORIDE 0.9 % IV BOLUS (SEPSIS)
1000.0000 mL | Freq: Once | INTRAVENOUS | Status: AC
  Administered 2012-08-31: 1000 mL via INTRAVENOUS

## 2012-08-31 MED ORDER — ONDANSETRON HCL 4 MG/2ML IJ SOLN
4.0000 mg | Freq: Once | INTRAMUSCULAR | Status: AC
  Administered 2012-08-31: 4 mg via INTRAVENOUS
  Filled 2012-08-31: qty 2

## 2012-08-31 MED ORDER — HYDROMORPHONE HCL PF 1 MG/ML IJ SOLN
1.0000 mg | Freq: Once | INTRAMUSCULAR | Status: AC
  Administered 2012-08-31: 1 mg via INTRAVENOUS
  Filled 2012-08-31: qty 1

## 2012-08-31 NOTE — ED Notes (Signed)
Pain and burning for over 2 weeks, seen at urgent care and treated with Cipro

## 2012-08-31 NOTE — ED Notes (Signed)
Pt alert & oriented x4, stable gait. Patient given discharge instructions, paperwork & prescription(s). Patient  instructed to stop at the registration desk to finish any additional paperwork. Patient verbalized understanding. Pt left department w/ no further questions. 

## 2012-08-31 NOTE — ED Provider Notes (Signed)
History   This chart was scribed for Heidi Lennert, MD by Gerlean Ren. This patient was seen in room APA19/APA19 and the patient's care was started at 19:59.   CSN: [id number]  Arrival date & time 08/31/12  1740   First MD Initiated Contact with Patient 08/31/12 1957      Chief Complaint  Patient presents with  . Abdominal Pain    (Consider location/radiation/quality/duration/timing/severity/associated sxs/prior treatment) The history is provided by the patient. No language interpreter was used.   [name] is a 36 y.o. female with h/o UTI and numerous nephrolithiases who presents to the Emergency Department complaining of 2 weeks of burning dysuria with associated urgency, lower abdominal pian and CVA tenderness that she reports is similar to that experienced with h/o kidney problems.  Pt reports associated nausea but denies associated emesis or hematuria.  Pt was recently dx with UTI and is currently on Cipro regiment.  Pt denies fever, neck pain, sore throat, visual disturbance, CP, cough, dyspnea, diarrhea, HA, weakness, numbness and rash as associated symptoms.  Pt is a current everyday smoker but denies alcohol use.      Past Medical History  Diagnosis Date  . Scoliosis (and kyphoscoliosis), idiopathic   . Arthritis   . Renal disorder     Past Surgical History  Procedure Date  . Back surgery   . Breast surgery     No family history on file.  History  Substance Use Topics  . Smoking status: Current Every Day Smoker  . Smokeless tobacco: Not on file  . Alcohol Use: No    No OB history provided.  Review of Systems  Constitutional: Negative for fever and fatigue.  HENT: Negative for congestion, sinus pressure and ear discharge.   Eyes: Negative for discharge.  Respiratory: Negative for cough.   Cardiovascular: Negative for chest pain.  Gastrointestinal: Positive for abdominal pain. Negative for vomiting, diarrhea and blood in stool.  Genitourinary: Positive  for dysuria and urgency. Negative for frequency and hematuria.  Musculoskeletal: Negative for back pain.  Skin: Negative for rash.  Neurological: Negative for seizures and headaches.  Hematological: Negative.   Psychiatric/Behavioral: Negative for hallucinations.    Allergies  Flagyl and Morphine and related  Home Medications   Current Outpatient Rx  Name Route Sig Dispense Refill  . ALPRAZOLAM 0.5 MG PO TABS Oral Take 0.5 mg by mouth daily as needed. For anxiety    . CIPROFLOXACIN HCL 500 MG PO TABS Oral Take 500 mg by mouth 2 (two) times daily. For UTI    . DICLOFENAC SODIUM 1 % TD GEL Topical Apply 1 application topically daily as needed. For neck pain    . FENTANYL 50 MCG/HR TD PT72 Transdermal Place 1 patch onto the skin every 3 (three) days.    . OXYCODONE HCL 10 MG PO TABS Oral Take 10 mg by mouth 4 (four) times daily as needed. For pain    . FUROSEMIDE 40 MG PO TABS Oral Take 40 mg by mouth daily as needed. For fluid    . PROMETHAZINE HCL 25 MG PO TABS Oral Take 0.5 tablets (12.5 mg total) by mouth every 6 (six) hours as needed for nausea. 20 tablet 0    BP 130/91  Pulse 138  Temp 97.5 F (36.4 C)  Resp 20  Ht 5\' 2"  (1.575 m)  Wt 126 lb (57.153 kg)  BMI 23.05 kg/m2  SpO2 100%  LMP 08/10/2012  Physical Exam  Nursing note and vitals reviewed.  Constitutional: She is oriented to person, place, and time. She appears well-developed.  HENT:  Head: Normocephalic and atraumatic.  Eyes: Conjunctivae normal and EOM are normal. No scleral icterus.  Neck: Neck supple. No thyromegaly present.  Cardiovascular: Normal rate and regular rhythm.  Exam reveals no gallop and no friction rub.   No murmur heard. Pulmonary/Chest: Effort normal. No stridor. She has no wheezes. She has no rales. She exhibits no tenderness.  Abdominal: Soft. She exhibits no distension. There is tenderness. There is no rebound.       Mild RLQ and suprapubic tenderness.  Musculoskeletal: Normal range of  motion. She exhibits no edema.       Mild bilateral CVA tenderness.  Lymphadenopathy:    She has no cervical adenopathy.  Neurological: She is oriented to person, place, and time. Coordination normal.  Skin: No rash noted. No erythema.  Psychiatric: She has a normal mood and affect. Her behavior is normal.    ED Course  Procedures (including critical care time) DIAGNOSTIC STUDIES: Oxygen Saturation is 100% on room air, normal by my interpretation.    COORDINATION OF CARE: 20:04- Patient informed of clinical course, understands medical decision-making process, and agrees with plan.  Ordered IV fluids, IV zofran, IV dilaudid, CBC, b-met, and abdominal XR.    Labs Reviewed  URINALYSIS, ROUTINE W REFLEX MICROSCOPIC - Abnormal; Notable for the following:    Specific Gravity, Urine <1.005 (*)     Hgb urine dipstick TRACE (*)     All other components within normal limits  URINE MICROSCOPIC-ADD ON - Abnormal; Notable for the following:    Squamous Epithelial / LPF MANY (*)     All other components within normal limits  PREGNANCY, URINE   Results for orders placed during the hospital encounter of 08/31/12  URINALYSIS, ROUTINE W REFLEX MICROSCOPIC      Component Value Range   Color, Urine YELLOW  YELLOW   APPearance CLEAR  CLEAR   Specific Gravity, Urine <1.005 (*) 1.005 - 1.030   pH 5.5  5.0 - 8.0   Glucose, UA NEGATIVE  NEGATIVE mg/dL   Hgb urine dipstick TRACE (*) NEGATIVE   Bilirubin Urine NEGATIVE  NEGATIVE   Ketones, ur NEGATIVE  NEGATIVE mg/dL   Protein, ur NEGATIVE  NEGATIVE mg/dL   Urobilinogen, UA 0.2  0.0 - 1.0 mg/dL   Nitrite NEGATIVE  NEGATIVE   Leukocytes, UA NEGATIVE  NEGATIVE  PREGNANCY, URINE      Component Value Range   Preg Test, Ur NEGATIVE  NEGATIVE  URINE MICROSCOPIC-ADD ON      Component Value Range   Squamous Epithelial / LPF MANY (*) RARE   WBC, UA 3-6  <3 WBC/hpf   RBC / HPF 0-2  <3 RBC/hpf   Bacteria, UA RARE  RARE  CBC WITH DIFFERENTIAL       Component Value Range   WBC 10.3  4.0 - 10.5 K/uL   RBC 4.35  3.87 - 5.11 MIL/uL   Hemoglobin 13.8  12.0 - 15.0 g/dL   HCT 16.1  09.6 - 04.5 %   MCV 93.6  78.0 - 100.0 fL   MCH 31.7  26.0 - 34.0 pg   MCHC 33.9  30.0 - 36.0 g/dL   RDW 40.9  81.1 - 91.4 %   Platelets 339  150 - 400 K/uL   Neutrophils Relative 67  43 - 77 %   Neutro Abs 7.0  1.7 - 7.7 K/uL   Lymphocytes Relative 25  12 -  46 %   Lymphs Abs 2.5  0.7 - 4.0 K/uL   Monocytes Relative 7  3 - 12 %   Monocytes Absolute 0.7  0.1 - 1.0 K/uL   Eosinophils Relative 1  0 - 5 %   Eosinophils Absolute 0.1  0.0 - 0.7 K/uL   Basophils Relative 0  0 - 1 %   Basophils Absolute 0.0  0.0 - 0.1 K/uL  BASIC METABOLIC PANEL      Component Value Range   Sodium 136  135 - 145 mEq/L   Potassium 3.8  3.5 - 5.1 mEq/L   Chloride 101  96 - 112 mEq/L   CO2 25  19 - 32 mEq/L   Glucose, Bld 108 (*) 70 - 99 mg/dL   BUN 10  6 - 23 mg/dL   Creatinine, Ser 1.61  0.50 - 1.10 mg/dL   Calcium 9.6  8.4 - 09.6 mg/dL   GFR calc non Af Amer >90  >90 mL/min   GFR calc Af Amer >90  >90 mL/min    Dg Abd 1 View  08/31/2012  *RADIOLOGY REPORT*  Clinical Data: Abdominal pain.  ABDOMEN - 1 VIEW  Comparison: 11/23/2011.  Findings: The abdominal bowel gas pattern is unremarkable.  There is moderate stool throughout the colon.  No findings for small bowel obstruction or free air.  Extensive thoracolumbar fusion hardware is noted.  Part of the right sacral fusion bar has been resected.  IMPRESSION: No plain film findings for an acute abdominal process.   Original Report Authenticated By: P. Loralie Champagne, M.D.      No diagnosis found.    MDM  The chart was scribed for me under my direct supervision.  I personally performed the history, physical, and medical decision making and all procedures in the evaluation of this patient.Heidi Lennert, MD 08/31/12 2203

## 2012-08-31 NOTE — ED Notes (Signed)
Pt provided ice chips at this time.  

## 2012-08-31 NOTE — ED Notes (Signed)
Pt up to restroom.

## 2012-08-31 NOTE — ED Notes (Signed)
Pt reports being TX for a UTI. Still taking antibiotics at this time. Pt states burning w/ urination. States some nausea denies vomiting.

## 2012-11-06 ENCOUNTER — Ambulatory Visit: Payer: Self-pay | Admitting: Emergency Medicine

## 2012-11-06 LAB — RAPID INFLUENZA A&B ANTIGENS

## 2015-12-30 ENCOUNTER — Encounter (HOSPITAL_COMMUNITY): Payer: Self-pay | Admitting: Emergency Medicine

## 2015-12-30 ENCOUNTER — Emergency Department (HOSPITAL_COMMUNITY)
Admission: EM | Admit: 2015-12-30 | Discharge: 2015-12-30 | Disposition: A | Discharge status: Home / Self Care | Payer: Medicare Other | Attending: Emergency Medicine | Admitting: Emergency Medicine

## 2015-12-30 ENCOUNTER — Emergency Department (HOSPITAL_COMMUNITY): Payer: Medicare Other

## 2015-12-30 DIAGNOSIS — F17210 Nicotine dependence, cigarettes, uncomplicated: Secondary | ICD-10-CM | POA: Diagnosis not present

## 2015-12-30 DIAGNOSIS — Z87448 Personal history of other diseases of urinary system: Secondary | ICD-10-CM | POA: Insufficient documentation

## 2015-12-30 DIAGNOSIS — K5901 Slow transit constipation: Secondary | ICD-10-CM | POA: Diagnosis not present

## 2015-12-30 DIAGNOSIS — Z8739 Personal history of other diseases of the musculoskeletal system and connective tissue: Secondary | ICD-10-CM | POA: Insufficient documentation

## 2015-12-30 DIAGNOSIS — Z79899 Other long term (current) drug therapy: Secondary | ICD-10-CM | POA: Diagnosis not present

## 2015-12-30 DIAGNOSIS — K6289 Other specified diseases of anus and rectum: Secondary | ICD-10-CM | POA: Diagnosis present

## 2015-12-30 DIAGNOSIS — K645 Perianal venous thrombosis: Secondary | ICD-10-CM | POA: Diagnosis not present

## 2015-12-30 DIAGNOSIS — Z3202 Encounter for pregnancy test, result negative: Secondary | ICD-10-CM | POA: Insufficient documentation

## 2015-12-30 DIAGNOSIS — K649 Unspecified hemorrhoids: Secondary | ICD-10-CM

## 2015-12-30 LAB — CBC WITH DIFFERENTIAL/PLATELET
Basophils Absolute: 0 10*3/uL (ref 0.0–0.1)
Basophils Relative: 0 %
Eosinophils Absolute: 0.3 10*3/uL (ref 0.0–0.7)
Eosinophils Relative: 3 %
HEMATOCRIT: 39.7 % (ref 36.0–46.0)
HEMOGLOBIN: 12.9 g/dL (ref 12.0–15.0)
LYMPHS ABS: 2.6 10*3/uL (ref 0.7–4.0)
LYMPHS PCT: 21 %
MCH: 30.9 pg (ref 26.0–34.0)
MCHC: 32.5 g/dL (ref 30.0–36.0)
MCV: 95 fL (ref 78.0–100.0)
Monocytes Absolute: 0.8 10*3/uL (ref 0.1–1.0)
Monocytes Relative: 7 %
NEUTROS PCT: 69 %
Neutro Abs: 8.4 10*3/uL — ABNORMAL HIGH (ref 1.7–7.7)
Platelets: 341 10*3/uL (ref 150–400)
RBC: 4.18 MIL/uL (ref 3.87–5.11)
RDW: 14 % (ref 11.5–15.5)
WBC: 12.2 10*3/uL — AB (ref 4.0–10.5)

## 2015-12-30 LAB — BASIC METABOLIC PANEL
Anion gap: 7 (ref 5–15)
BUN: 15 mg/dL (ref 6–20)
CHLORIDE: 105 mmol/L (ref 101–111)
CO2: 29 mmol/L (ref 22–32)
CREATININE: 0.73 mg/dL (ref 0.44–1.00)
Calcium: 9.1 mg/dL (ref 8.9–10.3)
GFR calc non Af Amer: >60 mL/min (ref >60)
Glucose, Bld: 124 mg/dL — ABNORMAL HIGH (ref 65–99)
Potassium: 3.5 mmol/L (ref 3.5–5.1)
SODIUM: 141 mmol/L (ref 135–145)

## 2015-12-30 LAB — POC URINE PREG, ED: PREG TEST UR: NEGATIVE

## 2015-12-30 MED ORDER — POLYETHYLENE GLYCOL 3350 17 G PO PACK: 17.0000 g | PACK | Freq: Two times a day (BID) | ORAL | Status: AC

## 2015-12-30 MED ORDER — LIDOCAINE HCL 2 % EX GEL: 1.0000 "application " | Freq: Once | CUTANEOUS | Status: DC

## 2015-12-30 MED ORDER — LIDOCAINE HCL 2 % EX GEL
CUTANEOUS | Status: AC
  Filled 2015-12-30: qty 30

## 2015-12-30 MED ORDER — ONDANSETRON 4 MG PO TBDP
4.0000 mg | ORAL_TABLET | Freq: Once | ORAL | Status: AC
  Administered 2015-12-30: 4 mg via ORAL
  Filled 2015-12-30: qty 1

## 2015-12-30 MED ORDER — IBUPROFEN 400 MG PO TABS
600.0000 mg | ORAL_TABLET | Freq: Once | ORAL | Status: AC
  Administered 2015-12-30: 600 mg via ORAL
  Filled 2015-12-30: qty 2

## 2015-12-30 MED ORDER — LIDOCAINE HCL 2 % EX GEL
1.0000 "application " | Freq: Once | CUTANEOUS | Status: AC
  Administered 2015-12-30: 1 via TOPICAL
  Filled 2015-12-30: qty 10

## 2015-12-30 MED ORDER — LIDOCAINE 5 % EX OINT: 1.0000 "application " | TOPICAL_OINTMENT | Freq: Four times a day (QID) | CUTANEOUS | Status: AC | PRN

## 2015-12-30 MED ORDER — DOCUSATE SODIUM 100 MG PO CAPS: 100.0000 mg | ORAL_CAPSULE | Freq: Two times a day (BID) | ORAL | Status: AC

## 2015-12-30 MED ORDER — LIDOCAINE HCL 2 % EX GEL
1.0000 "application " | Freq: Once | CUTANEOUS | Status: AC
  Administered 2015-12-30: 1 via TOPICAL

## 2015-12-30 MED ORDER — LIDOCAINE HCL 2 % EX GEL
CUTANEOUS | Status: AC
  Filled 2015-12-30: qty 10

## 2015-12-30 MED ORDER — POLYETHYLENE GLYCOL 3350 17 G PO PACK: 17.0000 g | PACK | Freq: Once | ORAL | Status: DC

## 2015-12-30 NOTE — ED Notes (Signed)
Pt states severe pain to rectal area since last night. Ongoing problems with hemorrhoids since having child. External hemorrhoids are present today with small thrombosed area.  Pt states vomiting on the way to ED. Pt states she is able to urinate, that it is just uncomfortable due to causing pressure to rectum. Pt states bright red blood to the stool last night.

## 2015-12-30 NOTE — ED Notes (Signed)
Per patient has hemorrhoids, recently straining to have BM large amount of bright red blood with blood clots noted. Patient also reports internal hemmorhiods are now the size of golf ball that protruding. Patient now has not been able to have BM since Thursday. Patient also reports urinary flow being interrupted by protrusion from rectum. Nausea and vomiting reported as well.

## 2015-12-30 NOTE — ED Provider Notes (Signed)
CSN: [id number]     Arrival date & time 12/30/15  1738 History   First MD Initiated Contact with Patient 12/30/15 1818     Chief Complaint  Patient presents with  . Rectal Pain    HPI Patient takes opiates for chronic low back pain. The patient states over the last several days she has had trouble with constipation and hemorrhoids. Patient states she's having a lot of pain in the rectal area and has felt a swollen protrusion. She's noticed some bleeding as well. She denies any dysuria but states is uncomfortable when she tries to urinate. She denies any fevers or chills. She has had some vomiting unfortunately when the pain is more severe. No abdominal pain. Past Medical History  Diagnosis Date  . Scoliosis (and kyphoscoliosis), idiopathic   . Arthritis   . Renal disorder    Past Surgical History  Procedure Laterality Date  . Back surgery    . Breast surgery     History reviewed. No pertinent family history. Social History  Substance Use Topics  . Smoking status: Current Every Day Smoker -- 0.50 packs/day    Types: Cigarettes  . Smokeless tobacco: Never Used  . Alcohol Use: No   OB History    Gravida Para Term Preterm AB TAB SAB Ectopic Multiple Living   Review of Systems  All other systems reviewed and are negative.     Allergies  Flagyl and Morphine and related  Home Medications   Prior to Admission medications   Medication Sig Start Date End Date Taking? Authorizing Provider  ALPRAZolam Prudy Feeler) 0.5 MG tablet Take 0.5 mg by mouth daily as needed. For anxiety   Yes Historical Provider, MD  citalopram (CELEXA) 40 MG tablet Take 40 mg by mouth daily.   Yes Historical Provider, MD  fentaNYL (DURAGESIC - DOSED MCG/HR) 25 MCG/HR patch Place 25 mcg onto the skin every 3 (three) days.   Yes Historical Provider, MD  oxyCODONE-acetaminophen (PERCOCET) 10-325 MG tablet Take 1 tablet by mouth every 4 (four) hours as needed for pain.   Yes Historical Provider,  MD  Prenatal Vit-Fe Fumarate-FA (MULTIVITAMIN-PRENATAL) 27-0.8 MG TABS tablet Take 1 tablet by mouth daily at 12 noon.   Yes Historical Provider, MD  docusate sodium (COLACE) 100 MG capsule Take 1 capsule (100 mg total) by mouth every 12 (twelve) hours. 12/30/15   Linwood Dibbles, MD  lidocaine (XYLOCAINE) 5 % ointment Apply 1 application topically 4 (four) times daily as needed. 12/30/15   Linwood Dibbles, MD  polyethylene glycol [facility]) packet Take 17 g by mouth 2 (two) times daily. 12/30/15   Linwood Dibbles, MD  promethazine (PHENERGAN) 25 MG tablet Take 0.5 tablets (12.5 mg total) by mouth every 6 (six) hours as needed for nausea. 03/13/12 03/20/12  Annamarie Dawley, MD   BP 111/79 mmHg  Pulse 100  Temp(Src) 98 F (36.7 C) (Oral)  Resp 20  Ht  (1.575 m)  Wt 46.72 kg  BMI 18.83 kg/m2  SpO2 100%  LMP 12/23/2015 Physical Exam  Constitutional: She appears well-developed and well-nourished. No distress.  HENT:  Head: Normocephalic and atraumatic.  Right Ear: External ear normal.  Left Ear: External ear normal.  Eyes: Conjunctivae are normal. Right eye exhibits no discharge. Left eye exhibits no discharge. No scleral icterus.  Neck: Neck supple. No tracheal deviation present.  Cardiovascular: Normal rate.   Pulmonary/Chest: Effort normal. No stridor. No  respiratory distress.  Abdominal: Soft. She exhibits no distension. There is no tenderness. There is no rebound and no guarding.  Genitourinary: Rectal exam shows external hemorrhoid and tenderness.  Small 2 mm area of clot noted on an external hemorrhoid that is tender, and moist noted bilaterally, no active bleeding  Musculoskeletal: She exhibits no edema.  Neurological: She is alert. Cranial nerve deficit: no gross deficits.  Skin: Skin is warm and dry. No rash noted.  Psychiatric: She has a normal mood and affect.  Nursing note and vitals reviewed.   ED Course  Procedures (including critical care time) Labs Review Labs Reviewed  CBC WITH  DIFFERENTIAL/PLATELET - Abnormal; Notable for the following:    WBC 12.2 (*)    Neutro Abs 8.4 (*)    All other components within normal limits  BASIC METABOLIC PANEL - Abnormal; Notable for the following:    Glucose, Bld 124 (*)    All other components within normal limits  POC URINE PREG, ED    Imaging Review Dg Abd Acute W/chest  12/30/2015  CLINICAL DATA:  40 year old presenting with severe perirectal pain and bright red blood per rectum which began last night. Current history of hemorrhoids. EXAM: DG ABDOMEN ACUTE W/ 1V CHEST COMPARISON:  Abdominal x-rays 08/31/2012 and earlier. CT abdomen and pelvis 03/06/2012 and earlier. Chest x-rays 07/06/2015 and earlier. FINDINGS: Bowel gas pattern unremarkable without evidence of obstruction or significant ileus. No evidence of free air or significant air-fluid levels on the erect image. Large stool burden in the colon. Phleboliths in both sides of the pelvis. No opaque urinary tract calculi on either side. Prior T10 through S1 fusion with Harrington rods. Remote fracture of a right-sided sacral loose group with. Cardiomediastinal silhouette unremarkable, unchanged. Lungs clear. Bronchovascular markings normal. Pulmonary vascularity normal. No visible pleural effusions. No pneumothorax. IMPRESSION: 1. No acute abdominal abnormality.  Large colonic stool burden. 2.  No acute cardiopulmonary disease. Electronically Signed   By: Hulan Saas M.D.   On: 12/30/2015 20:58   I have personally reviewed and evaluated these images and lab results as part of my medical decision-making.    MDM   Final diagnoses:  Slow transit constipation  Hemorrhoids, unspecified hemorrhoid type    Patient's constipation is related to her chronic narcotic use.  X-rays do show large colonic stool burden.  She does have hemorrhoids externally that are tender but no evidence of diffuse thrombosis.  Plan on discharge home with topical lidocaine. Stool softeners to help  with her constipation. Follow-up with primary doctor.    Linwood Dibbles, MD 12/30/15 2108

## 2015-12-30 NOTE — Discharge Instructions (Signed)
Constipation, Adult Constipation is when a person has fewer than three bowel movements a week, has difficulty having a bowel movement, or has stools that are dry, hard, or larger than normal. As people grow older, constipation is more common. A low-fiber diet, not taking in enough fluids, and taking certain medicines may make constipation worse.  CAUSES   Certain medicines, such as antidepressants, pain medicine, iron supplements, antacids, and water pills.   Certain diseases, such as diabetes, irritable bowel syndrome (IBS), thyroid disease, or depression.   Not drinking enough water.   Not eating enough fiber-rich foods.   Stress or travel.   Lack of physical activity or exercise.   Ignoring the urge to have a bowel movement.   Using laxatives too much.  SIGNS AND SYMPTOMS   Having fewer than three bowel movements a week.   Straining to have a bowel movement.   Having stools that are hard, dry, or larger than normal.   Feeling full or bloated.   Pain in the lower abdomen.   Not feeling relief after having a bowel movement.  DIAGNOSIS  Your health care provider will take a medical history and perform a physical exam. Further testing may be done for severe constipation. Some tests may include:  A barium enema X-ray to examine your rectum, colon, and, sometimes, your small intestine.   A sigmoidoscopy to examine your lower colon.   A colonoscopy to examine your entire colon. TREATMENT  Treatment will depend on the severity of your constipation and what is causing it. Some dietary treatments include drinking more fluids and eating more fiber-rich foods. Lifestyle treatments may include regular exercise. If these diet and lifestyle recommendations do not help, your health care provider may recommend taking over-the-counter laxative medicines to help you have bowel movements. Prescription medicines may be prescribed if over-the-counter medicines do not work.    HOME CARE INSTRUCTIONS   Eat foods that have a lot of fiber, such as fruits, vegetables, whole grains, and beans.  Limit foods high in fat and processed sugars, such as french fries, hamburgers, cookies, candies, and soda.   A fiber supplement may be added to your diet if you cannot get enough fiber from foods.   Drink enough fluids to keep your urine clear or pale yellow.   Exercise regularly or as directed by your health care provider.   Go to the restroom when you have the urge to go. Do not hold it.   Only take over-the-counter or prescription medicines as directed by your health care provider. Do not take other medicines for constipation without talking to your health care provider first.  SEEK IMMEDIATE MEDICAL CARE IF:   You have bright red blood in your stool.   Your constipation lasts for more than 4 days or gets worse.   You have abdominal or rectal pain.   You have thin, pencil-like stools.   You have unexplained weight loss. MAKE SURE YOU:   Understand these instructions.  Will watch your condition.  Will get help right away if you are not doing well or get worse.   This information is not intended to replace advice given to you by your health care provider. Make sure you discuss any questions you have with your health care provider.   Document Released: 08/01/2004 Document Revised: 11/24/2014 Document Reviewed: 08/15/2013 Elsevier Interactive Patient Education 2016 [employer].  Hemorrhoids Hemorrhoids are puffy (swollen) veins around the rectum or anus. Hemorrhoids can cause pain, itching, bleeding, or irritation.  HOME CARE  Eat foods with fiber, such as whole grains, beans, nuts, fruits, and vegetables. Ask your doctor about taking products with added fiber in them (fibersupplements).  Drink enough fluid to keep your pee (urine) clear or pale yellow.  Exercise often.  Go to the bathroom when you have the urge to poop. Do not  wait.  Avoid straining to poop (bowel movement).  Keep the butt area dry and clean. Use wet toilet paper or moist paper towels.  Medicated creams and medicine inserted into the anus (anal suppository) may be used or applied as told.  Only take medicine as told by your doctor.  Take a warm water bath (sitz bath) for 15-20 minutes to ease pain. Do this 3-4 times a day.  Place ice packs on the area if it is tender or puffy. Use the ice packs between the warm water baths.  Put ice in a plastic bag.  Place a towel between your skin and the bag.  Leave the ice on for 15-20 minutes, 03-04 times a day.  Do not use a donut-shaped pillow or sit on the toilet for a long time. GET HELP RIGHT AWAY IF:   You have more pain that is not controlled by treatment or medicine.  You have bleeding that will not stop.  You have trouble or are unable to poop (bowel movement).  You have pain or puffiness outside the area of the hemorrhoids. MAKE SURE YOU:   Understand these instructions.  Will watch your condition.  Will get help right away if you are not doing well or get worse.   This information is not intended to replace advice given to you by your health care provider. Make sure you discuss any questions you have with your health care provider.   Document Released: 08/12/2008 Document Revised: 10/20/2012 Document Reviewed: 09/14/2012 Elsevier Interactive Patient Education [employer]

## 2015-12-31 NOTE — ED Provider Notes (Signed)
I received a call from [facility] pharmacy regarding the patient's lidocaine prescription. The patient is unable to afford the prescription. On review of record, there is no clear necessity for this medication. I asked the pharmacy staff to tell the patient that it is okay to try the over-the-counter lidocaine, but that she will likely need to follow-up with her primary care doctor for further treatment.  Mancel Bale, MD 12/31/15 1118

## 2019-01-25 ENCOUNTER — Encounter (HOSPITAL_COMMUNITY): Payer: Self-pay | Admitting: Emergency Medicine

## 2019-01-25 ENCOUNTER — Emergency Department (HOSPITAL_COMMUNITY)
Admission: EM | Admit: 2019-01-25 | Discharge: 2019-01-26 | Disposition: A | Discharge status: Home / Self Care | Payer: Medicare Other | Attending: Emergency Medicine | Admitting: Emergency Medicine

## 2019-01-25 ENCOUNTER — Other Ambulatory Visit: Payer: Self-pay

## 2019-01-25 DIAGNOSIS — Z5321 Procedure and treatment not carried out due to patient leaving prior to being seen by health care provider: Secondary | ICD-10-CM | POA: Insufficient documentation

## 2019-01-25 DIAGNOSIS — M25531 Pain in right wrist: Secondary | ICD-10-CM | POA: Insufficient documentation
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
